# Patient Record
Sex: Male | Born: 1965 | Race: Black or African American | Hispanic: No | Smoking: Current some day smoker
Health system: Southern US, Community
[De-identification: ages and names within clinical notes are randomized; demographics above are authoritative.]

## PROBLEM LIST (undated history)

## (undated) DIAGNOSIS — I219 Acute myocardial infarction, unspecified: Secondary | ICD-10-CM

## (undated) DIAGNOSIS — I2510 Atherosclerotic heart disease of native coronary artery without angina pectoris: Secondary | ICD-10-CM

## (undated) DIAGNOSIS — E7800 Pure hypercholesterolemia, unspecified: Secondary | ICD-10-CM

## (undated) DIAGNOSIS — E119 Type 2 diabetes mellitus without complications: Secondary | ICD-10-CM

## (undated) DIAGNOSIS — I10 Essential (primary) hypertension: Secondary | ICD-10-CM

## (undated) HISTORY — PX: PERCUTANEOUS CORONARY STENT INTERVENTION (PCI-S): SHX6016

---

## 2014-09-29 ENCOUNTER — Ambulatory Visit: Payer: Self-pay

## 2014-09-29 ENCOUNTER — Encounter (INDEPENDENT_AMBULATORY_CARE_PROVIDER_SITE_OTHER): Payer: Self-pay

## 2020-02-15 ENCOUNTER — Emergency Department
Admission: EM | Admit: 2020-02-15 | Discharge: 2020-02-15 | Disposition: A | Discharge status: Home / Self Care | Payer: Medicaid Other | Attending: Student in an Organized Health Care Education/Training Program | Admitting: Student in an Organized Health Care Education/Training Program

## 2020-02-15 ENCOUNTER — Other Ambulatory Visit: Payer: Self-pay

## 2020-02-15 ENCOUNTER — Emergency Department: Payer: Medicaid Other

## 2020-02-15 DIAGNOSIS — M67911 Unspecified disorder of synovium and tendon, right shoulder: Secondary | ICD-10-CM | POA: Diagnosis not present

## 2020-02-15 DIAGNOSIS — M25511 Pain in right shoulder: Secondary | ICD-10-CM | POA: Diagnosis present

## 2020-02-15 DIAGNOSIS — M19011 Primary osteoarthritis, right shoulder: Secondary | ICD-10-CM | POA: Diagnosis not present

## 2020-02-15 MED ORDER — MELOXICAM 7.5 MG PO TABS
15.0000 mg | ORAL_TABLET | Freq: Once | ORAL | Status: AC
  Administered 2020-02-15: 15 mg via ORAL
  Filled 2020-02-15: qty 2

## 2020-02-15 MED ORDER — HYDROCODONE-ACETAMINOPHEN 5-325 MG PO TABS
1.0000 | ORAL_TABLET | Freq: Four times a day (QID) | ORAL | 0 refills | Status: AC | PRN
Start: 2020-02-15 — End: 2020-02-18

## 2020-02-15 MED ORDER — OXYCODONE HCL 5 MG PO TABS
5.0000 mg | ORAL_TABLET | Freq: Once | ORAL | Status: AC
  Administered 2020-02-15: 5 mg via ORAL
  Filled 2020-02-15: qty 1

## 2020-02-15 MED ORDER — MELOXICAM 15 MG PO TABS: 15.0000 mg | ORAL_TABLET | Freq: Every day | ORAL | 0 refills | Status: DC

## 2020-02-15 NOTE — ED Provider Notes (Signed)
[facility] Emergency Department Provider Note ____________________________________________  Time seen: Approximately 10:56 PM  I have reviewed the triage vital signs and the nursing notes.   HISTORY  Chief Complaint Shoulder Pain    HPI Mark Brooks is a 54 y.o. male who presents to the emergency department for evaluation and treatment of right shoulder pain. 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 No alleviating measures attempted prior to arrival.   No past medical history on file.  There are no problems to display for this patient.   Prior to Admission medications   Medication Sig Start Date End Date Taking? Authorizing Provider  HYDROcodone-acetaminophen (NORCO/VICODIN) 5-325 MG tablet Take 1 tablet by mouth every 6 (six) hours as needed for up to 3 days for severe pain. 02/15/20 02/18/20  Britlee Skolnik, Rulon Eisenmenger B, FNP  meloxicam (MOBIC) 15 MG tablet Take 1 tablet (15 mg total) by mouth daily. 02/15/20   Chinita Pester, FNP    Allergies Patient has no allergy information on record.  No family history on file.  Social History Social History   Tobacco Use  . Smoking status: Not on file  Substance Use Topics  . Alcohol use: Not on file  . Drug use: Not on file    Review of Systems Constitutional: Negative for fever. Cardiovascular: Negative for chest pain. Respiratory: Negative for shortness of breath. Musculoskeletal: Positive for right shoulder pain with radiation into the right arm and neck. Skin: Negative for open wounds or lesions. Neurological: Negative for decrease in sensation  ____________________________________________   PHYSICAL EXAM:  VITAL SIGNS: ED Triage Vitals  Enc Vitals Group     BP 02/15/20 1745 (!) 144/96     Pulse Rate 02/15/20 1745 88     Resp 02/15/20 1745 20     Temp 02/15/20 1745 99.3 F (37.4 C)     Temp Source 02/15/20 1745 Oral     SpO2  02/15/20 1745 99 %     Weight 02/15/20 1746 176 lb (79.8 kg)     Height 02/15/20 1746 5\' 8"  (1.727 m)     Head Circumference --      Peak Flow --      Pain Score 02/15/20 1754 10     Pain Loc --      Pain Edu? --      Excl. in GC? --     Constitutional: Alert and oriented. Well appearing and in no acute distress. Eyes: Conjunctivae are clear without discharge or drainage Head: Atraumatic Neck: Supple. No focal midline tenderness. Able to perform full range of motion Respiratory: No cough. Respirations are even and unlabored. Musculoskeletal: Diffuse subscapular pain with abduction and external rotation of the right shoulder. Painful arc at approximately 90 degrees but no drop. Neurologic: Motor and sensory function of the upper extremities are equal and intact. Skin: No edema, erythema, open wounds or lesions over the right upper extremity or subscapular area. Psychiatric: Affect and behavior are appropriate.  ____________________________________________   LABS (all labs ordered are listed, but only abnormal results are displayed)  Labs Reviewed - No data to display ____________________________________________  RADIOLOGY  Image of the right shoulder shows some mild degenerative joint disease but otherwise no acute endings.  I, 14/06/21, personally viewed and evaluated these images (plain radiographs) as part of my medical decision making, as well as reviewing the written report by the radiologist.  DG Shoulder Right  Result  Date: 02/15/2020 CLINICAL DATA:  Chronic right shoulder pain. EXAM: RIGHT SHOULDER - 2+ VIEW COMPARISON:  None. FINDINGS: There is no evidence of fracture or dislocation. Mild degenerative changes seen involving the right acromioclavicular joint. Soft tissues are unremarkable. IMPRESSION: Mild degenerative joint disease of the right acromioclavicular joint. No acute abnormality seen in the right shoulder. Electronically Signed   By: Lupita Raider M.D.    On: 02/15/2020 18:49   ____________________________________________   PROCEDURES  Procedures  ____________________________________________   INITIAL IMPRESSION / ASSESSMENT AND PLAN / ED COURSE  Mark Brooks is a 54 y.o. who presents to the emergency department for treatment and evaluation of chronic right shoulder pain. See HPI for further details. Exam is consistent with a rotator cuff tendinitis or strain. He also has some arthritis on plain film. He will be treated with meloxicam. He states that he is having difficulty getting comfortable at night. Short course of pain medication prescribed and he was encouraged to mainly take it at night before bed. He was encouraged to follow-up with orthopedics for symptoms that are not improving over the next week. He was encouraged to return to emergency department for symptoms of change or worsen if unable to schedule appointment.   Medications  meloxicam (MOBIC) tablet 15 mg (15 mg Oral Given 02/15/20 2313)  oxyCODONE (Oxy IR/ROXICODONE) immediate release tablet 5 mg (5 mg Oral Given 02/15/20 2314)    Pertinent labs & imaging results that were available during my care of the patient were reviewed by me and considered in my medical decision making (see chart for details).   _________________________________________   FINAL CLINICAL IMPRESSION(S) / ED DIAGNOSES  Final diagnoses:  Osteoarthritis of right shoulder, unspecified osteoarthritis type  Rotator cuff dysfunction, right    ED Discharge Orders         Ordered    meloxicam (MOBIC) 15 MG tablet  Daily        02/15/20 2304    HYDROcodone-acetaminophen (NORCO/VICODIN) 5-325 MG tablet  Every 6 hours PRN        02/15/20 2304           If controlled substance prescribed during this visit, 12 month history viewed on the [facility] prior to issuing an initial prescription for Schedule II or III opiod.   Chinita Pester, FNP 02/16/20 1710    Gilles Chiquito, MD 02/17/20 1030

## 2020-02-15 NOTE — ED Notes (Signed)
Pt walking home

## 2020-02-15 NOTE — ED Triage Notes (Signed)
Pt here with right shoulder and arm pain for a long time that has gotten worse over time. Pt NAD in triage.

## 2020-02-15 NOTE — Discharge Instructions (Signed)
Please follow up with orthopedics for symptoms that are not improving with medication and time.  Return to the ER for symptoms that change or worsen if unable to schedule an appointment.

## 2020-02-15 NOTE — ED Notes (Signed)
Pt acknowledges he will not be driving. Pt reports he is homeless and will not be walking.   Pt understands narcotics can cause drowsiness

## 2020-03-26 ENCOUNTER — Observation Stay
Admission: EM | Admit: 2020-03-26 | Discharge: 2020-03-28 | Disposition: A | Discharge status: Home / Self Care | Payer: Medicaid Other | Attending: Internal Medicine | Admitting: Internal Medicine

## 2020-03-26 ENCOUNTER — Encounter: Payer: Self-pay | Admitting: *Deleted

## 2020-03-26 DIAGNOSIS — R079 Chest pain, unspecified: Principal | ICD-10-CM | POA: Diagnosis present

## 2020-03-26 DIAGNOSIS — E1122 Type 2 diabetes mellitus with diabetic chronic kidney disease: Secondary | ICD-10-CM | POA: Insufficient documentation

## 2020-03-26 DIAGNOSIS — R778 Other specified abnormalities of plasma proteins: Secondary | ICD-10-CM | POA: Diagnosis not present

## 2020-03-26 DIAGNOSIS — Z20822 Contact with and (suspected) exposure to covid-19: Secondary | ICD-10-CM | POA: Diagnosis not present

## 2020-03-26 DIAGNOSIS — I5022 Chronic systolic (congestive) heart failure: Secondary | ICD-10-CM | POA: Diagnosis not present

## 2020-03-26 DIAGNOSIS — E1169 Type 2 diabetes mellitus with other specified complication: Secondary | ICD-10-CM

## 2020-03-26 DIAGNOSIS — N179 Acute kidney failure, unspecified: Secondary | ICD-10-CM | POA: Diagnosis not present

## 2020-03-26 DIAGNOSIS — N1831 Chronic kidney disease, stage 3a: Secondary | ICD-10-CM | POA: Diagnosis not present

## 2020-03-26 DIAGNOSIS — I130 Hypertensive heart and chronic kidney disease with heart failure and stage 1 through stage 4 chronic kidney disease, or unspecified chronic kidney disease: Secondary | ICD-10-CM | POA: Insufficient documentation

## 2020-03-26 DIAGNOSIS — I2510 Atherosclerotic heart disease of native coronary artery without angina pectoris: Secondary | ICD-10-CM | POA: Diagnosis not present

## 2020-03-26 DIAGNOSIS — Z87891 Personal history of nicotine dependence: Secondary | ICD-10-CM | POA: Diagnosis not present

## 2020-03-26 DIAGNOSIS — I160 Hypertensive urgency: Secondary | ICD-10-CM | POA: Diagnosis present

## 2020-03-26 HISTORY — DX: Acute myocardial infarction, unspecified: I21.9

## 2020-03-26 HISTORY — DX: Atherosclerotic heart disease of native coronary artery without angina pectoris: I25.10

## 2020-03-26 LAB — CBC
HCT: 43.6 % (ref 39.0–52.0)
Hemoglobin: 14.1 g/dL (ref 13.0–17.0)
MCH: 28.4 pg (ref 26.0–34.0)
MCHC: 32.3 g/dL (ref 30.0–36.0)
MCV: 87.7 fL (ref 80.0–100.0)
Platelets: 212 10*3/uL (ref 150–400)
RBC: 4.97 MIL/uL (ref 4.22–5.81)
RDW: 13.3 % (ref 11.5–15.5)
WBC: 6.5 10*3/uL (ref 4.0–10.5)
nRBC: 0 % (ref 0.0–0.2)

## 2020-03-26 LAB — BASIC METABOLIC PANEL
Anion gap: 10 (ref 5–15)
BUN: 30 mg/dL — ABNORMAL HIGH (ref 6–20)
CO2: 26 mmol/L (ref 22–32)
Calcium: 8.7 mg/dL — ABNORMAL LOW (ref 8.9–10.3)
Chloride: 100 mmol/L (ref 98–111)
Creatinine, Ser: 1.76 mg/dL — ABNORMAL HIGH (ref 0.61–1.24)
GFR, Estimated: 45 mL/min — ABNORMAL LOW (ref >60)
Glucose, Bld: 319 mg/dL — ABNORMAL HIGH (ref 70–99)
Potassium: 4.2 mmol/L (ref 3.5–5.1)
Sodium: 136 mmol/L (ref 135–145)

## 2020-03-26 LAB — BRAIN NATRIURETIC PEPTIDE: B Natriuretic Peptide: 1051.3 pg/mL — ABNORMAL HIGH (ref 0.0–100.0)

## 2020-03-26 LAB — CBG MONITORING, ED
Glucose-Capillary: 295 mg/dL — ABNORMAL HIGH (ref 70–99)
Glucose-Capillary: 319 mg/dL — ABNORMAL HIGH (ref 70–99)
Glucose-Capillary: 345 mg/dL — ABNORMAL HIGH (ref 70–99)

## 2020-03-26 LAB — TROPONIN I (HIGH SENSITIVITY)
Troponin I (High Sensitivity): 62 ng/L — ABNORMAL HIGH (ref <18)
Troponin I (High Sensitivity): 57 ng/L — ABNORMAL HIGH (ref <18)
Troponin I (High Sensitivity): 48 ng/L — ABNORMAL HIGH (ref <18)
Troponin I (High Sensitivity): 48 ng/L — ABNORMAL HIGH (ref <18)

## 2020-03-26 LAB — URINE DRUG SCREEN, QUALITATIVE (ARMC ONLY)
Amphetamines, Ur Screen: NOT DETECTED
Barbiturates, Ur Screen: NOT DETECTED
Benzodiazepine, Ur Scrn: NOT DETECTED
Cannabinoid 50 Ng, Ur ~~LOC~~: NOT DETECTED
Cocaine Metabolite,Ur ~~LOC~~: NOT DETECTED
MDMA (Ecstasy)Ur Screen: NOT DETECTED
Methadone Scn, Ur: NOT DETECTED
Opiate, Ur Screen: NOT DETECTED
Phencyclidine (PCP) Ur S: NOT DETECTED
Tricyclic, Ur Screen: NOT DETECTED

## 2020-03-26 LAB — MAGNESIUM: Magnesium: 1.9 mg/dL (ref 1.7–2.4)

## 2020-03-26 MED ORDER — ACETAMINOPHEN 325 MG PO TABS: 650.0000 mg | ORAL_TABLET | Freq: Four times a day (QID) | ORAL | Status: DC | PRN

## 2020-03-26 MED ORDER — ENOXAPARIN SODIUM 30 MG/0.3ML ~~LOC~~ SOLN
30.0000 mg | SUBCUTANEOUS | Status: DC
  Administered 2020-03-27: 30 mg via SUBCUTANEOUS
  Filled 2020-03-26 (×2): qty 0.3

## 2020-03-26 MED ORDER — MORPHINE SULFATE (PF) 2 MG/ML IV SOLN
2.0000 mg | INTRAVENOUS | Status: DC | PRN
  Administered 2020-03-27: 2 mg via INTRAVENOUS
  Filled 2020-03-26: qty 1

## 2020-03-26 MED ORDER — TAMSULOSIN HCL 0.4 MG PO CAPS
0.4000 mg | ORAL_CAPSULE | Freq: Every day | ORAL | Status: DC
  Administered 2020-03-27 – 2020-03-28 (×2): 0.4 mg via ORAL
  Filled 2020-03-26 (×2): qty 1

## 2020-03-26 MED ORDER — DORZOLAMIDE HCL-TIMOLOL MAL 2-0.5 % OP SOLN
1.0000 [drp] | Freq: Two times a day (BID) | OPHTHALMIC | Status: DC
  Filled 2020-03-26: qty 10

## 2020-03-26 MED ORDER — INSULIN ASPART PROT & ASPART (70-30 MIX) 100 UNIT/ML ~~LOC~~ SUSP
20.0000 [IU] | Freq: Two times a day (BID) | SUBCUTANEOUS | Status: DC
  Administered 2020-03-27 – 2020-03-28 (×3): 20 [IU] via SUBCUTANEOUS
  Filled 2020-03-26 (×3): qty 10

## 2020-03-26 MED ORDER — AMLODIPINE BESYLATE 5 MG PO TABS
10.0000 mg | ORAL_TABLET | Freq: Every day | ORAL | Status: DC
  Administered 2020-03-27 – 2020-03-28 (×2): 10 mg via ORAL
  Filled 2020-03-26 (×2): qty 2

## 2020-03-26 MED ORDER — ISOSORBIDE MONONITRATE ER 60 MG PO TB24
30.0000 mg | ORAL_TABLET | Freq: Every day | ORAL | Status: DC
  Administered 2020-03-27 – 2020-03-28 (×2): 30 mg via ORAL
  Filled 2020-03-26 (×2): qty 1

## 2020-03-26 MED ORDER — METOPROLOL SUCCINATE ER 50 MG PO TB24
50.0000 mg | ORAL_TABLET | Freq: Every day | ORAL | Status: DC
  Administered 2020-03-27 – 2020-03-28 (×2): 50 mg via ORAL
  Filled 2020-03-26 (×2): qty 1

## 2020-03-26 MED ORDER — FUROSEMIDE 40 MG PO TABS
40.0000 mg | ORAL_TABLET | Freq: Every day | ORAL | Status: DC
  Administered 2020-03-27: 40 mg via ORAL
  Filled 2020-03-26: qty 1

## 2020-03-26 MED ORDER — NITROGLYCERIN 0.4 MG SL SUBL: 0.4000 mg | SUBLINGUAL_TABLET | SUBLINGUAL | Status: DC | PRN

## 2020-03-26 MED ORDER — ONDANSETRON HCL 4 MG/2ML IJ SOLN
4.0000 mg | Freq: Three times a day (TID) | INTRAMUSCULAR | Status: DC | PRN
  Administered 2020-03-27: 4 mg via INTRAVENOUS
  Filled 2020-03-26: qty 2

## 2020-03-26 MED ORDER — ASPIRIN 81 MG PO CHEW
324.0000 mg | CHEWABLE_TABLET | Freq: Once | ORAL | Status: AC
  Administered 2020-03-26: 324 mg via ORAL
  Filled 2020-03-26: qty 4

## 2020-03-26 MED ORDER — NICOTINE 21 MG/24HR TD PT24
21.0000 mg | MEDICATED_PATCH | Freq: Every day | TRANSDERMAL | Status: DC
  Administered 2020-03-27 – 2020-03-28 (×2): 21 mg via TRANSDERMAL
  Filled 2020-03-26 (×2): qty 1

## 2020-03-26 MED ORDER — INSULIN ASPART 100 UNIT/ML ~~LOC~~ SOLN
0.0000 [IU] | Freq: Every day | SUBCUTANEOUS | Status: DC
  Administered 2020-03-26: 4 [IU] via SUBCUTANEOUS
  Filled 2020-03-26: qty 1

## 2020-03-26 MED ORDER — INSULIN ASPART 100 UNIT/ML ~~LOC~~ SOLN
0.0000 [IU] | Freq: Three times a day (TID) | SUBCUTANEOUS | Status: DC
  Administered 2020-03-26: 5 [IU] via SUBCUTANEOUS
  Administered 2020-03-26: 11 [IU] via SUBCUTANEOUS
  Filled 2020-03-26 (×2): qty 1

## 2020-03-26 MED ORDER — PANTOPRAZOLE SODIUM 40 MG PO TBEC
40.0000 mg | DELAYED_RELEASE_TABLET | Freq: Every day | ORAL | Status: DC
  Administered 2020-03-27 – 2020-03-28 (×2): 40 mg via ORAL
  Filled 2020-03-26 (×2): qty 1

## 2020-03-26 MED ORDER — ATORVASTATIN CALCIUM 20 MG PO TABS
20.0000 mg | ORAL_TABLET | Freq: Every day | ORAL | Status: DC
  Administered 2020-03-27 – 2020-03-28 (×2): 20 mg via ORAL
  Filled 2020-03-26 (×2): qty 1

## 2020-03-26 MED ORDER — HYDRALAZINE HCL 20 MG/ML IJ SOLN
5.0000 mg | INTRAMUSCULAR | Status: DC | PRN
  Administered 2020-03-27: 5 mg via INTRAVENOUS
  Filled 2020-03-26: qty 1

## 2020-03-26 MED ORDER — CLOPIDOGREL BISULFATE 75 MG PO TABS
75.0000 mg | ORAL_TABLET | Freq: Every day | ORAL | Status: DC
  Administered 2020-03-27 – 2020-03-28 (×2): 75 mg via ORAL
  Filled 2020-03-26 (×2): qty 1

## 2020-03-26 MED ORDER — SPIRONOLACTONE 25 MG PO TABS
25.0000 mg | ORAL_TABLET | Freq: Every day | ORAL | Status: DC
  Administered 2020-03-27 – 2020-03-28 (×2): 25 mg via ORAL
  Filled 2020-03-26 (×2): qty 1

## 2020-03-26 NOTE — ED Notes (Signed)
Pt to room 38 now. Pt standing at bedside to use urinal. Will attach to monitor once done.

## 2020-03-26 NOTE — ED Notes (Signed)
BG=[id number]

## 2020-03-26 NOTE — Consult Note (Signed)
Mark Brooks is a 55 y.o. male  [id number]  Primary Cardiologist: Adrian Blackwater Reason for Consultation: Chest pain  HPI: This is a 55 year old African-American male with history of PCI and stenting after myocardial infarction at [facility] in March 2021 presented to the hospital with whole body pain including back pain right shoulder pain and right arm pain radiating to the back associated with some pressure in the chest which woke him up from sleep.   Review of Systems: No orthopnea PND or leg swelling   Past Medical History:  Diagnosis Date  . Coronary artery disease   . Myocardial infarct [facility])     (Not in a hospital admission)      Infusions:   No Known Allergies  Social History   Socioeconomic History  . Marital status: Divorced    Spouse name: Not on file  . Number of children: Not on file  . Years of education: Not on file  . Highest education level: Not on file  Occupational History  . Not on file  Tobacco Use  . Smoking status: Former Games developer  . Smokeless tobacco: Never Used  Substance and Sexual Activity  . Alcohol use: Not on file  . Drug use: Not on file  . Sexual activity: Not on file  Other Topics Concern  . Not on file  Social History Narrative  . Not on file   Social Determinants of Health   Financial Resource Strain: Not on file  Food Insecurity: Not on file  Transportation Needs: Not on file  Physical Activity: Not on file  Stress: Not on file  Social Connections: Not on file  Intimate Partner Violence: Not on file    History reviewed. No pertinent family history.  PHYSICAL EXAM: Vitals:   03/26/20 0725  BP: (!) 156/98  Pulse: 90  Resp: 16  Temp: 98.3 F (36.8 C)  SpO2: 100%    No intake or output data in the 24 hours ending 03/26/20 0955  General:  Well appearing. No respiratory difficulty HEENT: normal Neck: supple. no JVD. Carotids 2+ bilat; no bruits. No lymphadenopathy or thryomegaly appreciated. Cor: PMI  nondisplaced. Regular rate & rhythm. No rubs, gallops or murmurs. Lungs: clear Abdomen: soft, nontender, nondistended. No hepatosplenomegaly. No bruits or masses. Good bowel sounds. Extremities: no cyanosis, clubbing, rash, edema Neuro: alert & oriented x 3, cranial nerves grossly intact. moves all 4 extremities w/o difficulty. Affect pleasant.  ECG: Normal sinus rhythm with nonspecific ST-T changes  Results for orders placed or performed during the hospital encounter of 03/26/20 (from the past 24 hour(s))  Basic metabolic panel     Status: Abnormal   Collection Time: 03/26/20  6:48 AM  Result Value Ref Range   Sodium 136 135 - 145 mmol/L   Potassium 4.2 3.5 - 5.1 mmol/L   Chloride 100 98 - 111 mmol/L   CO2 26 22 - 32 mmol/L   Glucose, Bld 319 (H) 70 - 99 mg/dL   BUN 30 (H) 6 - 20 mg/dL   Creatinine, Ser 6.65 (H) 0.61 - 1.24 mg/dL   Calcium 8.7 (L) 8.9 - 10.3 mg/dL   GFR, Estimated 45 (L) >60 mL/min   Anion gap 10 5 - 15  CBC     Status: None   Collection Time: 03/26/20  6:48 AM  Result Value Ref Range   WBC 6.5 4.0 - 10.5 K/uL   RBC 4.97 4.22 - 5.81 MIL/uL   Hemoglobin 14.1 13.0 - 17.0 g/dL  HCT 43.6 39.0 - 52.0 %   MCV 87.7 80.0 - 100.0 fL   MCH 28.4 26.0 - 34.0 pg   MCHC 32.3 30.0 - 36.0 g/dL   RDW 70.3 50.0 - 93.8 %   Platelets 212 150 - 400 K/uL   nRBC 0.0 0.0 - 0.2 %  Troponin I (High Sensitivity)     Status: Abnormal   Collection Time: 03/26/20  6:48 AM  Result Value Ref Range   Troponin I (High Sensitivity) 62 (H) <18 ng/L  Magnesium     Status: None   Collection Time: 03/26/20  6:48 AM  Result Value Ref Range   Magnesium 1.9 1.7 - 2.4 mg/dL   No results found.   ASSESSMENT AND PLAN: Atypical chest pain with EKG unremarkable with history of PCI and stenting.  Advise starting the patient on isosorbide and aspirin/Plavix and statins, and rule out myocardial infarction, start heparin if chest pain gets worse.  If he rules out for myocardial infarction he can be  discharged with follow-up in the office 930 Tuesday in my office.  And will do further work-up such as echo and stress test.  Alora Gorey A

## 2020-03-26 NOTE — H&P (Signed)
History and Physical    Mark Brooks MBT:[id number] DOB: 1965/05/25 DOA: 03/26/2020  Referring MD/NP/PA:   PCP: Patient, No Pcp Per   Patient coming from:  The patient is coming from home.  At baseline, pt is independent for most of ADL.        Chief Complaint: Chest pain  HPI: Mark Brooks is a 55 y.o. male with medical history significant of hypertension, diabetes mellitus, CAD, myocardial infarction, stent placement, former smoker, CHF with a EF 30%, CKD stage IIIa, who presents with chest pain.  Patient states that his chest pain started at about 4 AM, which is located in central chest, moderate, pressure-like, radiating to the back and right shoulder.  Associated with mild shortness of breath.  No cough, fever or chills.  Patient denies diarrhea, nausea, vomiting, abdominal pain.  No symptoms of UTI or unilateral weakness.  ED Course: pt was found to have troponin level 62, 53, 48, pending COVID PCR, worsening renal function, temperature normal, blood pressure 180/107, 155/110, heart rate 90, RR 16, oxygen saturation 99% on room air.  Patient is placed on progressive for observation.  Dr. Welton Flakes of cardiology is consulted  Review of Systems:   General: no fevers, chills, no body weight gain,  has fatigue HEENT: no blurry vision, hearing changes or sore throat Respiratory: has dyspnea, no coughing, wheezing CV: has chest pain, no palpitations GI: no nausea, vomiting, abdominal pain, diarrhea, constipation GU: no dysuria, burning on urination, increased urinary frequency, hematuria  Ext: no leg edema Neuro: no unilateral weakness, numbness, or tingling, no vision change or hearing loss Skin: no rash, no skin tear. MSK: No muscle spasm, no deformity, no limitation of range of movement in spin. Has right shoulder pain Heme: No easy bruising.  Travel history: No recent long distant travel.  Allergy: No Known Allergies  Past Medical History:  Diagnosis Date  . Coronary artery  disease   . Myocardial infarct [facility])     Past Surgical History:  Procedure Laterality Date  . PERCUTANEOUS CORONARY STENT INTERVENTION (PCI-S)      Social History:  reports that he has quit smoking. He has never used smokeless tobacco. He reports previous alcohol use. He reports that he does not use drugs.  Family History:  Family History  Problem Relation Age of Onset  . Diabetes Mellitus II Mother   . Heart disease Father      Prior to Admission medications   Medication Sig Start Date End Date Taking? Authorizing Provider  meloxicam (MOBIC) 15 MG tablet Take 1 tablet (15 mg total) by mouth daily. 02/15/20   Chinita Pester, FNP    Physical Exam: Vitals:   03/26/20 0725 03/26/20 1000 03/26/20 1409 03/26/20 1600  BP: (!) 156/98 (!) 151/110 (!) 159/108 (!) 163/104  Pulse: 90 90 97 (!) 104  Resp: 16 13    Temp: 98.3 F (36.8 C)     TempSrc: Oral     SpO2: 100% 99% 94% 95%   General: Not in acute distress HEENT:       Eyes: PERRL, EOMI, no scleral icterus.       ENT: No discharge from the ears and nose, no pharynx injection, no tonsillar enlargement.        Neck: No JVD, no bruit, no mass felt. Heme: No neck lymph node enlargement. Cardiac: S1/S2, RRR, No murmurs, No gallops or rubs. Respiratory: No rales, wheezing, rhonchi or rubs. GI: Soft, nondistended, nontender, no rebound pain, no organomegaly, BS present.  GU: No hematuria Ext: No pitting leg edema bilaterally. 2+DP/PT pulse bilaterally. Musculoskeletal: No joint deformities, No joint redness or warmth, no limitation of ROM in spin. Skin: No rashes.  Neuro: Alert, oriented X3, cranial nerves II-XII grossly intact, moves all extremities normally. Psych: Patient is not psychotic, no suicidal or hemocidal ideation.  Labs on Admission: I have personally reviewed following labs and imaging studies  CBC: Recent Labs  Lab 03/26/20 0648  WBC 6.5  HGB 14.1  HCT 43.6  MCV 87.7  PLT 212   Basic Metabolic  Panel: Recent Labs  Lab 03/26/20 0648  NA 136  K 4.2  CL 100  CO2 26  GLUCOSE 319*  BUN 30*  CREATININE 1.76*  CALCIUM 8.7*  MG 1.9   GFR: CrCl cannot be calculated (Unknown ideal weight.). Liver Function Tests: No results for input(s): AST, ALT, ALKPHOS, BILITOT, PROT, ALBUMIN in the last 168 hours. No results for input(s): LIPASE, AMYLASE in the last 168 hours. No results for input(s): AMMONIA in the last 168 hours. Coagulation Profile: No results for input(s): INR, PROTIME in the last 168 hours. Cardiac Enzymes: No results for input(s): CKTOTAL, CKMB, CKMBINDEX, TROPONINI in the last 168 hours. BNP (last 3 results) No results for input(s): PROBNP in the last 8760 hours. HbA1C: No results for input(s): HGBA1C in the last 72 hours. CBG: Recent Labs  Lab 03/26/20 1343  GLUCAP 295*   Lipid Profile: No results for input(s): CHOL, HDL, LDLCALC, TRIG, CHOLHDL, LDLDIRECT in the last 72 hours. Thyroid Function Tests: No results for input(s): TSH, T4TOTAL, FREET4, T3FREE, THYROIDAB in the last 72 hours. Anemia Panel: No results for input(s): VITAMINB12, FOLATE, FERRITIN, TIBC, IRON, RETICCTPCT in the last 72 hours. Urine analysis: No results found for: COLORURINE, APPEARANCEUR, LABSPEC, PHURINE, GLUCOSEU, HGBUR, BILIRUBINUR, KETONESUR, PROTEINUR, UROBILINOGEN, NITRITE, LEUKOCYTESUR Sepsis Labs: @LABRCNTIP (procalcitonin:4,lacticidven:4) )No results found for this or any previous visit (from the past 240 hour(s)).   Radiological Exams on Admission: No results found.   EKG: I have personally reviewed.  Sinus rhythm, QTC 472, LAE, mild T wave inversion in V4-V6   Assessment/Plan Principal Problem:   Chest pain Active Problems:   Coronary artery disease   Acute renal failure superimposed on stage 3a chronic kidney disease (HCC)   Chronic systolic CHF (congestive heart failure) (HCC)   Hypertensive urgency   Elevated troponin   Chest pain, elevated trop and hx of  CAD: s/p of stent. Trop 62 -->57 -->48.  Likely due to demand ischemia secondary to hypertensive urgency. Dr.Khan of card is consulted.    - place to progressive unit for observation - Trend Trop - Repeat EKG in the am  - prn Nitroglycerin, Morphine - pt is on plavix - start imdur - on lipitor  - Risk factor stratification: will check FLP and A1C  - check UDS - 2d echo  Acute renal failure superimposed on stage 3a chronic kidney disease (HCC): Baseline creatinine 1.3 recently, his creatinine is 1.76, BUN 30. -Hold lisinopril -Follow-up with BMP  Chronic systolic CHF (congestive heart failure) (HCC): 2D echo 01/13/2017 showed a EF of 30%.  Patient does not have leg edema or JVD.  CHF is compensated. -Continue Lasix and spironolactone  Hypertensive urgency -Increase amlodipine dose from 5 to 10 mg daily -Continue metoprolol -Patient is on Lasix and spironolactone - Added Imdur    DVT ppx:  SQ Lovenox Code Status: Full code Family Communication:    Yes, patient's girlfriend   at bed side Disposition Plan:  Anticipate  discharge back to previous environment Consults called: Dr. Welton Flakes of cardiology Admission status:    progressive unit for obs   Status is: Observation  The patient remains OBS appropriate and will d/c before 2 midnights.  Dispo: The patient is from: Home              Anticipated d/c is to: Home              Anticipated d/c date is: 1 day              Patient currently is not medically stable to d/c.         Date of Service 03/26/2020    Lorretta Harp [facility]   If 7PM-7AM, please contact night-coverage [email or web address] 03/26/2020, 6:24 PM

## 2020-03-26 NOTE — ED Triage Notes (Signed)
First rn note: per ems pt with right sided shoulder blade pain. Per ems pt with 180/107 b/p, hr 94. Per ems pt also complains of shob this am.

## 2020-03-26 NOTE — ED Triage Notes (Signed)
Patient reports right sided shoulder pain, chronic in nature, that radiates down arm and into back. Patient also states central chest pain that woke him up from his sleep associated with shortness of breath.

## 2020-03-26 NOTE — ED Notes (Signed)
Blood glucose 319.

## 2020-03-26 NOTE — ED Provider Notes (Signed)
[facility] Emergency Department Provider Note   ____________________________________________   Event Date/Time   First MD Initiated Contact with Patient 03/26/20 0930     (approximate)  I have reviewed the triage vital signs and the nursing notes.   HISTORY  Chief Complaint Back Pain, Chest Pain, and Shortness of Breath    HPI Mark Brooks is a 55 y.o. male with past medical history of hypertension, hyperlipidemia, diabetes, CAD, and CKD who presents to the ED complaining of chest pain. Patient reports last night he had sudden onset of pressure in the center of his chest, which he states felt like someone was sitting on his chest. This was associated with some difficulty breathing, however pain and shortness of breath have eased up since onset. He does complain of chronic pain in the area of his neck and right shoulder blade that radiates down his right arm. He states this is different than the chest pain, which came on separately. He denies any recent fevers, cough, pain or swelling in his legs. He was admitted to [facility] for STEMI last year, but has not been following up with cardiology since then.        Past Medical History:  Diagnosis Date  . Coronary artery disease   . Myocardial infarct [facility])     Patient Active Problem List   Diagnosis Date Noted  . Chest pain 03/26/2020  . Coronary artery disease   . Acute renal failure superimposed on stage 3a chronic kidney disease (HCC)   . Chronic systolic CHF (congestive heart failure) (HCC)   . Hypertensive urgency   . Elevated troponin     History reviewed. No pertinent surgical history.  Prior to Admission medications   Medication Sig Start Date End Date Taking? Authorizing Provider  meloxicam (MOBIC) 15 MG tablet Take 1 tablet (15 mg total) by mouth daily. 02/15/20   Chinita Pester, FNP    Allergies Patient has no known allergies.  History reviewed. No pertinent family history.  Social  History Social History   Tobacco Use  . Smoking status: Former Games developer  . Smokeless tobacco: Never Used    Review of Systems  Constitutional: No fever/chills Eyes: No visual changes. ENT: No sore throat. Cardiovascular: Positive for chest pain. Respiratory: Positive for shortness of breath. Gastrointestinal: No abdominal pain.  No nausea, no vomiting.  No diarrhea.  No constipation. Genitourinary: Negative for dysuria. Musculoskeletal: Negative for back pain. Positive for neck pain and right arm pain. Skin: Negative for rash. Neurological: Negative for headaches, focal weakness or numbness.  ____________________________________________   PHYSICAL EXAM:  VITAL SIGNS: ED Triage Vitals  Enc Vitals Group     BP 03/26/20 0725 (!) 156/98     Pulse Rate 03/26/20 0725 90     Resp 03/26/20 0725 16     Temp 03/26/20 0725 98.3 F (36.8 C)     Temp Source 03/26/20 0725 Oral     SpO2 03/26/20 0725 100 %     Weight --      Height --      Head Circumference --      Peak Flow --      Pain Score 03/26/20 0720 10     Pain Loc --      Pain Edu? --      Excl. in GC? --     Constitutional: Alert and oriented. Eyes: Conjunctivae are normal. Head: Atraumatic. Nose: No congestion/rhinnorhea. Mouth/Throat: Mucous membranes are moist. Neck: Normal ROM Cardiovascular: Normal  rate, regular rhythm. Grossly normal heart sounds. 2+ radial pulses bilaterally. Respiratory: Normal respiratory effort.  No retractions. Lungs CTAB. No chest wall tenderness to palpation. Gastrointestinal: Soft and nontender. No distention. Genitourinary: deferred Musculoskeletal: No lower extremity tenderness nor edema. Neurologic:  Normal speech and language. No gross focal neurologic deficits are appreciated. Skin:  Skin is warm, dry and intact. No rash noted. Psychiatric: Mood and affect are normal. Speech and behavior are normal.  ____________________________________________   LABS (all labs ordered are  listed, but only abnormal results are displayed)  Labs Reviewed  BASIC METABOLIC PANEL - Abnormal; Notable for the following components:      Result Value   Glucose, Bld 319 (*)    BUN 30 (*)    Creatinine, Ser 1.76 (*)    Calcium 8.7 (*)    GFR, Estimated 45 (*)    All other components within normal limits  TROPONIN I (HIGH SENSITIVITY) - Abnormal; Notable for the following components:   Troponin I (High Sensitivity) 62 (*)    All other components within normal limits  TROPONIN I (HIGH SENSITIVITY) - Abnormal; Notable for the following components:   Troponin I (High Sensitivity) 57 (*)    All other components within normal limits  SARS CORONAVIRUS 2 (TAT 6-24 HRS)  CBC  MAGNESIUM  BRAIN NATRIURETIC PEPTIDE  URINE DRUG SCREEN, QUALITATIVE ([facility] ONLY)  TROPONIN I (HIGH SENSITIVITY)   ____________________________________________  EKG  ED ECG REPORT I, Chesley Noon, the attending physician, personally viewed and interpreted this ECG.   Date: 03/26/2020  EKG Time: 7:33  Rate: 92  Rhythm: normal sinus rhythm  Axis: Normal  Intervals:Prolonged QT  ST&T Change: Nonspecific T wave changes   PROCEDURES  Procedure(s) performed (including Critical Care):  Procedures   ____________________________________________   INITIAL IMPRESSION / ASSESSMENT AND PLAN / ED COURSE       55 year old male with past medical history of hypertension, hyperlipidemia, diabetes, CKD, and CAD who presents to the ED complaining of acute onset chest pressure starting last night that has since eased up. EKG shows nonspecific T wave changes and initial troponin is mildly elevated at 62. Elevation could be related to chronic kidney disease however there is no prior prior comparison, we will hold off on heparin for now and trend given easing of patient's chest pain. Chest x-ray reviewed by me and shows no infiltrate, edema, or effusion. Case discussed with Dr. Welton Flakes of cardiology, who will see the  patient in consultation, recommends admission for observation given high risk chest pain.      ____________________________________________   FINAL CLINICAL IMPRESSION(S) / ED DIAGNOSES  Final diagnoses:  Chest pain, unspecified type     ED Discharge Orders    None       Note:  This document was prepared using Dragon voice recognition software and may include unintentional dictation errors.   Chesley Noon, MD 03/26/20 1101

## 2020-03-27 ENCOUNTER — Observation Stay
Admit: 2020-03-27 | Discharge: 2020-03-27 | Disposition: A | Discharge status: Home / Self Care | Payer: Medicaid Other | Attending: Internal Medicine | Admitting: Internal Medicine

## 2020-03-27 DIAGNOSIS — N1831 Chronic kidney disease, stage 3a: Secondary | ICD-10-CM | POA: Diagnosis not present

## 2020-03-27 DIAGNOSIS — R079 Chest pain, unspecified: Secondary | ICD-10-CM | POA: Diagnosis not present

## 2020-03-27 DIAGNOSIS — N179 Acute kidney failure, unspecified: Secondary | ICD-10-CM | POA: Diagnosis not present

## 2020-03-27 DIAGNOSIS — I160 Hypertensive urgency: Secondary | ICD-10-CM | POA: Diagnosis not present

## 2020-03-27 LAB — CBC
HCT: 42.1 % (ref 39.0–52.0)
Hemoglobin: 14.3 g/dL (ref 13.0–17.0)
MCH: 28.7 pg (ref 26.0–34.0)
MCHC: 34 g/dL (ref 30.0–36.0)
MCV: 84.4 fL (ref 80.0–100.0)
Platelets: 214 10*3/uL (ref 150–400)
RBC: 4.99 MIL/uL (ref 4.22–5.81)
RDW: 13.2 % (ref 11.5–15.5)
WBC: 9.2 10*3/uL (ref 4.0–10.5)
nRBC: 0 % (ref 0.0–0.2)

## 2020-03-27 LAB — BASIC METABOLIC PANEL
Anion gap: 8 (ref 5–15)
BUN: 26 mg/dL — ABNORMAL HIGH (ref 6–20)
CO2: 24 mmol/L (ref 22–32)
Calcium: 8.6 mg/dL — ABNORMAL LOW (ref 8.9–10.3)
Chloride: 102 mmol/L (ref 98–111)
Creatinine, Ser: 1.73 mg/dL — ABNORMAL HIGH (ref 0.61–1.24)
GFR, Estimated: 46 mL/min — ABNORMAL LOW (ref >60)
Glucose, Bld: 218 mg/dL — ABNORMAL HIGH (ref 70–99)
Potassium: 4 mmol/L (ref 3.5–5.1)
Sodium: 134 mmol/L — ABNORMAL LOW (ref 135–145)

## 2020-03-27 LAB — LIPID PANEL
Cholesterol: 248 mg/dL — ABNORMAL HIGH (ref 0–200)
HDL: 36 mg/dL — ABNORMAL LOW (ref >40)
LDL Cholesterol: 199 mg/dL — ABNORMAL HIGH (ref 0–99)
Total CHOL/HDL Ratio: 6.9 RATIO
Triglycerides: 65 mg/dL (ref <150)
VLDL: 13 mg/dL (ref 0–40)

## 2020-03-27 LAB — CBG MONITORING, ED
Glucose-Capillary: 302 mg/dL — ABNORMAL HIGH (ref 70–99)
Glucose-Capillary: 280 mg/dL — ABNORMAL HIGH (ref 70–99)
Glucose-Capillary: 379 mg/dL — ABNORMAL HIGH (ref 70–99)
Glucose-Capillary: 288 mg/dL — ABNORMAL HIGH (ref 70–99)
Glucose-Capillary: 165 mg/dL — ABNORMAL HIGH (ref 70–99)

## 2020-03-27 LAB — TROPONIN I (HIGH SENSITIVITY)
Troponin I (High Sensitivity): 62 ng/L — ABNORMAL HIGH (ref <18)
Troponin I (High Sensitivity): 57 ng/L — ABNORMAL HIGH (ref <18)

## 2020-03-27 LAB — HIV ANTIBODY (ROUTINE TESTING W REFLEX): HIV Screen 4th Generation wRfx: NONREACTIVE

## 2020-03-27 LAB — HEMOGLOBIN A1C
Hgb A1c MFr Bld: 12.6 % — ABNORMAL HIGH (ref 4.8–5.6)
Mean Plasma Glucose: 314.92 mg/dL

## 2020-03-27 LAB — SARS CORONAVIRUS 2 (TAT 6-24 HRS): SARS Coronavirus 2: NEGATIVE

## 2020-03-27 MED ORDER — LABETALOL HCL 5 MG/ML IV SOLN: 10.0000 mg | INTRAVENOUS | Status: DC | PRN

## 2020-03-27 MED ORDER — INSULIN ASPART 100 UNIT/ML ~~LOC~~ SOLN
10.0000 [IU] | Freq: Once | SUBCUTANEOUS | Status: AC
  Administered 2020-03-27: 10 [IU] via SUBCUTANEOUS

## 2020-03-27 MED ORDER — INSULIN ASPART 100 UNIT/ML ~~LOC~~ SOLN
0.0000 [IU] | Freq: Three times a day (TID) | SUBCUTANEOUS | Status: DC
  Administered 2020-03-27: 11 [IU] via SUBCUTANEOUS
  Administered 2020-03-27: 8 [IU] via SUBCUTANEOUS
  Administered 2020-03-27: 3 [IU] via SUBCUTANEOUS
  Administered 2020-03-27: 8 [IU] via SUBCUTANEOUS
  Administered 2020-03-28: 15 [IU] via SUBCUTANEOUS
  Filled 2020-03-27 (×3): qty 1

## 2020-03-27 NOTE — Progress Notes (Signed)
*  PRELIMINARY RESULTS* Echocardiogram 2D Echocardiogram has been performed.  Mark Brooks M Mark Brooks 03/27/2020, 8:57 AM 

## 2020-03-27 NOTE — ED Notes (Addendum)
PRN Hydralazine 5mg  given to pt d/t BP being 181/108.  After receiving hydralazine, pt stated he felt dizzy, lightheaded and nauseous.  BP retaken and was  115/76.  , NP notified of large drop in BP.  Will closely monitor pt.

## 2020-03-27 NOTE — ED Notes (Signed)
Pharmacy called, second request for lovenox. States will send.

## 2020-03-27 NOTE — Progress Notes (Signed)
PROGRESS NOTE    Mark Brooks  AST:[id number] DOB: 11-Apr-1965 DOA: 03/26/2020 PCP: Patient, No Pcp Per    Assessment & Plan:   Principal Problem:   Chest pain Active Problems:   Coronary artery disease   Acute renal failure superimposed on stage 3a chronic kidney disease (HCC)   Chronic systolic CHF (congestive heart failure) (HCC)   Hypertensive urgency   Elevated troponin  Chest pain: w/ elevated troponins, likely secondary to demand ischemia. Hx of CAD. Echo pending. Continue on tele. Cardio following and recs apprec. Continue on plavix, imdur, statin. Nitro, morphine prn for pain    AKI on CKDIIIa: baseline Cr around 1.3. Continue to hold home dose of lisinopril  Chronic systolic CHF: echo 01/13/2017 showed a EF of 30%. Appears euvolemic. Continue on home dose of lasix, spironolactone   Hypertensive urgency: continue on increased dose of amlodipine. Continue on metoprolol, lasix, imdur & spironolactone   Social issues: pt is homeless. Will consult TOC      DVT prophylaxis: lovenox  Code Status: full  Family Communication:  Disposition Plan: likely d/c back home   Status is: Observation  The patient remains OBS appropriate and will d/c before 2 midnights.  Dispo: The patient is from: Home              Anticipated d/c is to: Home              Anticipated d/c date is: 1 day              Patient currently is not medically stable to d/c.     Consultants:   cardio   Procedures:    Antimicrobials:   Subjective: Pt c/o intermittent chest pain   Objective: Vitals:   03/27/20 0300 03/27/20 0600 03/27/20 0800 03/27/20 0813  BP: (!) 145/98 (!) 148/108 (!) 152/98 (!) 152/98  Pulse: 91 100 97   Resp: 16 18 17    Temp:      TempSrc:      SpO2: 98% 98% 96%    No intake or output data in the 24 hours ending 03/27/20 0829 There were no vitals filed for this visit.  Examination:  General exam: Appears calm and comfortable  Respiratory system: Clear to  auscultation. Respiratory effort normal. Cardiovascular system: S1 & S2+. No rubs, gallops or clicks.  Gastrointestinal system: Abdomen is nondistended, soft and nontender.  Normal bowel sounds heard. Central nervous system: Alert and oriented. Moves all 4 extremities  Psychiatry: Judgement and insight appear normal. Flat mood and affect     Data Reviewed: I have personally reviewed following labs and imaging studies  CBC: Recent Labs  Lab 03/26/20 0648  WBC 6.5  HGB 14.1  HCT 43.6  MCV 87.7  PLT 212   Basic Metabolic Panel: Recent Labs  Lab 03/26/20 0648  NA 136  K 4.2  CL 100  CO2 26  GLUCOSE 319*  BUN 30*  CREATININE 1.76*  CALCIUM 8.7*  MG 1.9   GFR: CrCl cannot be calculated (Unknown ideal weight.). Liver Function Tests: No results for input(s): AST, ALT, ALKPHOS, BILITOT, PROT, ALBUMIN in the last 168 hours. No results for input(s): LIPASE, AMYLASE in the last 168 hours. No results for input(s): AMMONIA in the last 168 hours. Coagulation Profile: No results for input(s): INR, PROTIME in the last 168 hours. Cardiac Enzymes: No results for input(s): CKTOTAL, CKMB, CKMBINDEX, TROPONINI in the last 168 hours. BNP (last 3 results) No results for input(s): PROBNP in the last 8760  hours. HbA1C: No results for input(s): HGBA1C in the last 72 hours. CBG: Recent Labs  Lab 03/26/20 1343 03/26/20 1831 03/26/20 2200 03/27/20 0146 03/27/20 0809  GLUCAP 295* 319* 345* 302* 280*   Lipid Profile: Recent Labs    03/27/20 0538  CHOL 248*  HDL 36*  LDLCALC 199*  TRIG 65  CHOLHDL 6.9   Thyroid Function Tests: No results for input(s): TSH, T4TOTAL, FREET4, T3FREE, THYROIDAB in the last 72 hours. Anemia Panel: No results for input(s): VITAMINB12, FOLATE, FERRITIN, TIBC, IRON, RETICCTPCT in the last 72 hours. Sepsis Labs: No results for input(s): PROCALCITON, LATICACIDVEN in the last 168 hours.  Recent Results (from the past 240 hour(s))  SARS CORONAVIRUS 2  (TAT 6-24 HRS) Nasopharyngeal Nasopharyngeal Swab     Status: None   Collection Time: 03/26/20 12:13 PM   Specimen: Nasopharyngeal Swab  Result Value Ref Range Status   SARS Coronavirus 2 NEGATIVE NEGATIVE Final    Comment: (NOTE) SARS-CoV-2 target nucleic acids are NOT DETECTED.  The SARS-CoV-2 RNA is generally detectable in upper and lower respiratory specimens during the acute phase of infection. Negative results do not preclude SARS-CoV-2 infection, do not rule out co-infections with other pathogens, and should not be used as the sole basis for treatment or other patient management decisions. Negative results must be combined with clinical observations, patient history, and epidemiological information. The expected result is Negative.  Fact Sheet for Patients: HairSlick.no  Fact Sheet for Healthcare Providers: quierodirigir.com  This test is not yet approved or cleared by the [place] FDA and  has been authorized for detection and/or diagnosis of SARS-CoV-2 by FDA under an Emergency Use Authorization (EUA). This EUA will remain  in effect (meaning this test can be used) for the duration of the COVID-19 declaration under Se ction 564(b)(1) of the Act, 21 U.S.C. section 360bbb-3(b)(1), unless the authorization is terminated or revoked sooner.  Performed at [facility], [address] [place], [place] [address]          Radiology Studies: No results found.      Scheduled Meds: . amLODipine  10 mg Oral Daily  . atorvastatin  20 mg Oral Daily  . clopidogrel  75 mg Oral Daily  . dorzolamide-timolol  1 drop Right Eye BID  . enoxaparin (LOVENOX) injection  30 mg Subcutaneous Q24H  . furosemide  40 mg Oral Daily  . insulin aspart  0-15 Units Subcutaneous TID AC & HS  . insulin aspart protamine- aspart  20 Units Subcutaneous BID WC  . isosorbide mononitrate  30 mg Oral Daily  . metoprolol succinate   50 mg Oral Daily  . nicotine  21 mg Transdermal Daily  . pantoprazole  40 mg Oral Daily  . spironolactone  25 mg Oral Daily  . tamsulosin  0.4 mg Oral Daily   Continuous Infusions:   LOS: 0 days    Time spent: 33 mins     Charise Killian, MD [facility] Pager [phone]  If 7PM-7AM, please contact night-coverage 03/27/2020, 8:29 AM

## 2020-03-27 NOTE — ED Notes (Signed)
Patient referred to this RN as "boo boo kitty" when checking blood glucose levels. Advised to patient that my name is Vernona Rieger, and this is how I prefer to be addressed. Also patient asked if my tattoo hurt. He then asked to get his billfold so he can "Make it rain." This RN advised patient that such behavior is not appropriate.

## 2020-03-27 NOTE — ED Notes (Signed)
Pt placed on 2L 

## 2020-03-27 NOTE — ED Notes (Signed)
bg 288

## 2020-03-27 NOTE — ED Notes (Signed)
Pt angry when this nurse walked in room. Pt stated he was using the urinal, no urinal noted in pts hand. Pt uncovered from waist down and on the phone. Pt states he gets no respect. This RN apologized and stated that I knocked as I entered. Pt states that was not true and threw phone in direction of nurse. This RN walked out of room without engaging pt further.

## 2020-03-28 DIAGNOSIS — E1169 Type 2 diabetes mellitus with other specified complication: Secondary | ICD-10-CM | POA: Diagnosis not present

## 2020-03-28 DIAGNOSIS — N1831 Chronic kidney disease, stage 3a: Secondary | ICD-10-CM | POA: Diagnosis not present

## 2020-03-28 DIAGNOSIS — Z794 Long term (current) use of insulin: Secondary | ICD-10-CM

## 2020-03-28 DIAGNOSIS — R079 Chest pain, unspecified: Secondary | ICD-10-CM | POA: Diagnosis not present

## 2020-03-28 DIAGNOSIS — N179 Acute kidney failure, unspecified: Secondary | ICD-10-CM | POA: Diagnosis not present

## 2020-03-28 LAB — BASIC METABOLIC PANEL
Anion gap: 10 (ref 5–15)
BUN: 26 mg/dL — ABNORMAL HIGH (ref 6–20)
CO2: 25 mmol/L (ref 22–32)
Calcium: 8.4 mg/dL — ABNORMAL LOW (ref 8.9–10.3)
Chloride: 101 mmol/L (ref 98–111)
Creatinine, Ser: 1.79 mg/dL — ABNORMAL HIGH (ref 0.61–1.24)
GFR, Estimated: 44 mL/min — ABNORMAL LOW (ref >60)
Glucose, Bld: 294 mg/dL — ABNORMAL HIGH (ref 70–99)
Potassium: 4 mmol/L (ref 3.5–5.1)
Sodium: 136 mmol/L (ref 135–145)

## 2020-03-28 LAB — ECHOCARDIOGRAM COMPLETE
AR max vel: 1.59 cm2
AV Area VTI: 1.67 cm2
AV Area mean vel: 1.76 cm2
AV Mean grad: 4 mmHg
AV Peak grad: 9.2 mmHg
Ao pk vel: 1.52 m/s
Area-P 1/2: 7.37 cm2
Calc EF: 28.8 %
MV VTI: 2.45 cm2
S' Lateral: 4.5 cm
Single Plane A2C EF: 34.3 %
Single Plane A4C EF: 26.8 %

## 2020-03-28 LAB — CBC
HCT: 42.1 % (ref 39.0–52.0)
Hemoglobin: 13.8 g/dL (ref 13.0–17.0)
MCH: 28 pg (ref 26.0–34.0)
MCHC: 32.8 g/dL (ref 30.0–36.0)
MCV: 85.6 fL (ref 80.0–100.0)
Platelets: 206 10*3/uL (ref 150–400)
RBC: 4.92 MIL/uL (ref 4.22–5.81)
RDW: 13 % (ref 11.5–15.5)
WBC: 6.3 10*3/uL (ref 4.0–10.5)
nRBC: 0 % (ref 0.0–0.2)

## 2020-03-28 LAB — CBG MONITORING, ED: Glucose-Capillary: 370 mg/dL — ABNORMAL HIGH (ref 70–99)

## 2020-03-28 MED ORDER — ISOSORBIDE MONONITRATE ER 30 MG PO TB24: 30.0000 mg | ORAL_TABLET | Freq: Every day | ORAL | 0 refills | Status: DC

## 2020-03-28 MED ORDER — NICOTINE 21 MG/24HR TD PT24: 21.0000 mg | MEDICATED_PATCH | Freq: Every day | TRANSDERMAL | 0 refills | Status: AC

## 2020-03-28 NOTE — TOC Initial Note (Signed)
Transition of Care [facility]) - Initial/Assessment Note    Patient Details  Name: Ahan Eisenberger MRN: [id number] Date of Birth: 04/17/1965  [facility] CM/SW Contact:    Elsmere Cellar, RN Phone Number: 03/28/2020, 1:43 PM  Clinical Narrative:                 Attempted to see patient in ED related to Homeless concerns however patient already discharged home. No answer to telephone call post discharge.         Patient Goals and CMS Choice        Expected Discharge Plan and Services           Expected Discharge Date: 03/28/20                                    Prior Living Arrangements/Services                       Activities of Daily Living      Permission Sought/Granted                  Emotional Assessment              Admission diagnosis:  Chest pain [R07.9] Patient Active Problem List   Diagnosis Date Noted   Chest pain 03/26/2020   Coronary artery disease    Acute renal failure superimposed on stage 3a chronic kidney disease (HCC)    Chronic systolic CHF (congestive heart failure) (HCC)    Hypertensive urgency    Elevated troponin    PCP:  Patient, No Pcp Per Pharmacy:   [facility] [address]), [place] - [address] [place]) [place] [address] Phone: [phone] Fax: [phone]     Social Determinants of Health (SDOH) Interventions    Readmission Risk Interventions No flowsheet data found.

## 2020-03-28 NOTE — Discharge Summary (Addendum)
Physician Discharge Summary  Mark Brooks SHF:[id number] DOB: 09-Oct-1965 DOA: 03/26/2020  PCP: Patient, No Pcp Per  Admit date: 03/26/2020 Discharge date: 03/28/2020  Admitted From: home Disposition: home  Recommendations for Outpatient Follow-up:  1. Follow up with PCP in 1 week & will need to get BMP to check Cr level 2. F/u cardio, Dr. Welton Flakes, 03/29/20 at 9:30AM  Home Health: no  Equipment/Devices:  Discharge Condition: stable  CODE STATUS: full  Diet recommendation: Heart Healthy / Carb Modified   Brief/Interim Summary: HPI was taken from Dr. Welton Flakes: Mark Brooks is a 55 y.o. male with medical history significant of hypertension, diabetes mellitus, CAD, myocardial infarction, stent placement, former smoker, CHF with a EF 30%, CKD stage IIIa, who presents with chest pain.  Patient states that his chest pain started at about 4 AM, which is located in central chest, moderate, pressure-like, radiating to the back and right shoulder.  Associated with mild shortness of breath.  No cough, fever or chills.  Patient denies diarrhea, nausea, vomiting, abdominal pain.  No symptoms of UTI or unilateral weakness.  ED Course: pt was found to have troponin level 62, 53, 48, pending COVID PCR, worsening renal function, temperature normal, blood pressure 180/107, 155/110, heart rate 90, RR 16, oxygen saturation 99% on room air.  Patient is placed on progressive for observation.  Dr. Welton Flakes of cardiology is consulted  Hospital Course from Dr. Mayford Knife 1/16-1/17/22: Pt presented w/ chest pain that atypical as per cardio. Troponins were initially minimally elevated but then started to trend down. Pt continued on metoprolol, plavix, imdur, statin, lasix & spironolactone as per cardio. Pt will f/u w/ cardio, Dr. Welton Flakes, on 03/29/20 at 9:30AM.  Of note, pt was found to have AKI on CKD. Pt was taking NSAIDs at home and NSAIDs were d/c and not continued at d/c. Pt will need to f/u w/ PCP or cardio to get BMP to check  Cr level. Pt verbalized his understanding.   Discharge Diagnoses:  Principal Problem:   Chest pain Active Problems:   Coronary artery disease   Acute renal failure superimposed on stage 3a chronic kidney disease (HCC)   Chronic systolic CHF (congestive heart failure) (HCC)   Hypertensive urgency   Elevated troponin  Chest pain: w/ minimally elevated troponins, likely secondary to demand ischemia. Chest pain resolved prior to d/c. Hx of CAD. Continue on tele. Cardio following and recs apprec. Continue on plavix, imdur, statin, spironolactone as per cardio. Echo shows EF 25-30% & grade III diastolic dysfunction. Repeat echo & stress test will be done outpatient as per cardio. Nitro, morphine prn for pain    AKI on CKDIIIa: baseline Cr around 1.3. Continue to hold home dose of lisinopril  DM2: poorly controlled w/ HbA1c 12.6. Continue on novolog, SSI w/ accuchecks   Chronic systolic CHF: echo 01/13/2017 showed a EF of 30%. Appears euvolemic. Continue on home dose of metoprolol, lasix, spironolactone   Hypertensive urgency: continue on increased dose of amlodipine. Continue on metoprolol, lasix, imdur & spironolactone. HTN urgency resolved prior to d/c.  Social issues: pt is homeless but refuses to go to a homeless shelter   Discharge Instructions  Discharge Instructions    Diet - low sodium heart healthy   Complete by: As directed    Diet Carb Modified   Complete by: As directed    Discharge instructions   Complete by: As directed    F/u PCP within 1 week & will need BMP to check Cr/kidney function. F/u w/ cardio,  Dr. Welton Flakes, on 03/29/20 at 9:30 AM. Do not take any NSAIDs/meloxicam as this can cause kidney damage   Increase activity slowly   Complete by: As directed      Allergies as of 03/28/2020   No Known Allergies     Medication List    STOP taking these medications   meloxicam 15 MG tablet Commonly known as: MOBIC     TAKE these medications   amLODipine 5 MG  tablet Commonly known as: NORVASC Take 5 mg by mouth daily.   atorvastatin 20 MG tablet Commonly known as: LIPITOR Take 20 mg by mouth daily.   clopidogrel 75 MG tablet Commonly known as: PLAVIX Take 75 mg by mouth daily.   dorzolamide-timolol 22.3-6.8 MG/ML ophthalmic solution Commonly known as: COSOPT Place 1 drop into the right eye 2 (two) times daily.   furosemide 40 MG tablet Commonly known as: LASIX Take 40 mg by mouth daily.   insulin NPH-regular Human (70-30) 100 UNIT/ML injection Inject 20-25 Units into the skin 2 (two) times daily. (based upon blood sugar reading)   isosorbide mononitrate 30 MG 24 hr tablet Commonly known as: IMDUR Take 1 tablet (30 mg total) by mouth daily.   lisinopril 40 MG tablet Commonly known as: ZESTRIL Take 40 mg by mouth daily.   metFORMIN 500 MG 24 hr tablet Commonly known as: GLUCOPHAGE-XR Take 500 mg by mouth 2 (two) times daily.   metoprolol succinate 50 MG 24 hr tablet Commonly known as: TOPROL-XL Take 50 mg by mouth daily.   omeprazole 20 MG tablet Commonly known as: PRILOSEC OTC Take 20 mg by mouth daily.   spironolactone 25 MG tablet Commonly known as: ALDACTONE Take 25 mg by mouth daily.   tamsulosin 0.4 MG Caps capsule Commonly known as: FLOMAX Take 0.4 mg by mouth daily.       Follow-up Information    Laurier Nancy, MD. Go to.   Specialty: Cardiology Why: 03/29/20 at 9:30 AM Contact information: [address] [place] [address] [phone]              No Known Allergies  Consultations: Cardio, Dr. Welton Flakes   Procedures/Studies: ECHOCARDIOGRAM COMPLETE  Result Date: 03/28/2020    ECHOCARDIOGRAM REPORT   Patient Name:   Mark Brooks Date of Exam: 03/27/2020 Medical Rec #:  [id number]     Height:       68.0 in Accession #:    [id number]    Weight:       176.0 lb Date of Birth:  01-26-1966     BSA:          1.936 m Patient Age:    54 years      BP:           148/108 mmHg Patient Gender: M              HR:           96 bpm. Exam Location:  [facility] Procedure: Color Doppler and Cardiac Doppler Indications:     R07.9 Chest Pain  History:         Patient has no prior history of Echocardiogram examinations.                  CAD and Previous Myocardial Infarction. Two stents placed                  05/2019.  Sonographer:     Humphrey Rolls RDCS (AE) Referring Phys:  Kern Reap Lorretta Harp  Diagnosing Phys: Adrian BlackwaterShaukat Khan MD IMPRESSIONS  1. Left ventricular ejection fraction, by estimation, is 25 to 30%. The left ventricle has severely decreased function. The left ventricle demonstrates global hypokinesis. The left ventricular internal cavity size was severely dilated. There is moderate  concentric left ventricular hypertrophy. Left ventricular diastolic parameters are consistent with Grade III diastolic dysfunction (restrictive).  2. Right ventricular systolic function is moderately reduced. The right ventricular size is moderately enlarged. Mildly increased right ventricular wall thickness.  3. Left atrial size was severely dilated.  4. Right atrial size was severely dilated.  5. The mitral valve is normal in structure. Mild mitral valve regurgitation. No evidence of mitral stenosis.  6. The aortic valve is normal in structure. Aortic valve regurgitation is not visualized. No aortic stenosis is present.  7. The inferior vena cava is normal in size with greater than 50% respiratory variability, suggesting right atrial pressure of 3 mmHg. Conclusion(s)/Recommendation(s): Findings consistent with ischemic cardiomyopathy. FINDINGS  Left Ventricle: Left ventricular ejection fraction, by estimation, is 25 to 30%. The left ventricle has severely decreased function. The left ventricle demonstrates global hypokinesis. The left ventricular internal cavity size was severely dilated. There is moderate concentric left ventricular hypertrophy. Left ventricular diastolic parameters are consistent with Grade III diastolic dysfunction (restrictive).  Right Ventricle: The right ventricular size is moderately enlarged. Mildly increased right ventricular wall thickness. Right ventricular systolic function is moderately reduced. Left Atrium: Left atrial size was severely dilated. Right Atrium: Right atrial size was severely dilated. Pericardium: There is no evidence of pericardial effusion. Mitral Valve: The mitral valve is normal in structure. Mild mitral valve regurgitation. No evidence of mitral valve stenosis. MV peak gradient, 5.0 mmHg. The mean mitral valve gradient is 3.0 mmHg. Tricuspid Valve: The tricuspid valve is normal in structure. Tricuspid valve regurgitation is mild . No evidence of tricuspid stenosis. Aortic Valve: The aortic valve is normal in structure. Aortic valve regurgitation is not visualized. No aortic stenosis is present. Aortic valve mean gradient measures 4.0 mmHg. Aortic valve peak gradient measures 9.2 mmHg. Aortic valve area, by VTI measures 1.67 cm. Pulmonic Valve: The pulmonic valve was normal in structure. Pulmonic valve regurgitation is mild. No evidence of pulmonic stenosis. Aorta: The aortic root was not well visualized. Venous: The inferior vena cava is normal in size with greater than 50% respiratory variability, suggesting right atrial pressure of 3 mmHg. IAS/Shunts: No atrial level shunt detected by color flow Doppler.  LEFT VENTRICLE PLAX 2D LVIDd:         5.40 cm      Diastology LVIDs:         4.50 cm      LV e' medial:    2.83 cm/s LV PW:         1.10 cm      LV E/e' medial:  34.8 LV IVS:        1.00 cm      LV e' lateral:   5.87 cm/s LVOT diam:     2.00 cm      LV E/e' lateral: 16.8 LV SV:         42 LV SV Index:   22 LVOT Area:     3.14 cm  LV Volumes (MOD) LV vol d, MOD A2C: 113.0 ml LV vol d, MOD A4C: 111.0 ml LV vol s, MOD A2C: 74.2 ml LV vol s, MOD A4C: 81.2 ml LV SV MOD A2C:     38.8 ml LV SV MOD A4C:  111.0 ml LV SV MOD BP:      32.5 ml RIGHT VENTRICLE RV Basal diam:  2.60 cm TAPSE (M-mode): 2.1 cm LEFT ATRIUM              Index       RIGHT ATRIUM           Index LA diam:        4.20 cm 2.17 cm/m  RA Area:     12.00 cm LA Vol (A2C):   47.2 ml 24.38 ml/m RA Volume:   26.10 ml  13.48 ml/m LA Vol (A4C):   32.3 ml 16.69 ml/m LA Biplane Vol: 41.3 ml 21.34 ml/m  AORTIC VALVE                   PULMONIC VALVE AV Area (Vmax):    1.59 cm    PV Vmax:       0.92 m/s AV Area (Vmean):   1.76 cm    PV Vmean:      60.300 cm/s AV Area (VTI):     1.67 cm    PV VTI:        0.160 m AV Vmax:           152.00 cm/s PV Peak grad:  3.4 mmHg AV Vmean:          99.200 cm/s PV Mean grad:  2.0 mmHg AV VTI:            0.252 m AV Peak Grad:      9.2 mmHg AV Mean Grad:      4.0 mmHg LVOT Vmax:         77.00 cm/s LVOT Vmean:        55.600 cm/s LVOT VTI:          0.134 m LVOT/AV VTI ratio: 0.53  AORTA Ao Root diam: 3.90 cm MITRAL VALVE MV Area (PHT): 7.37 cm    SHUNTS MV Area VTI:   2.45 cm    Systemic VTI:  0.13 m MV Peak grad:  5.0 mmHg    Systemic Diam: 2.00 cm MV Mean grad:  3.0 mmHg MV Vmax:       1.12 m/s MV Vmean:      73.0 cm/s MV Decel Time: 103 msec MV E velocity: 98.50 cm/s MV A velocity: 64.30 cm/s MV E/A ratio:  1.53 Adrian Blackwater MD Electronically signed by Adrian Blackwater MD Signature Date/Time: 03/28/2020/9:31:31 AM    Final     (Echo, Carotid, EGD, Colonoscopy, ERCP)    Subjective:   Discharge Exam: Vitals:   03/28/20 0730 03/28/20 0842  BP:  (!) 149/102  Pulse: 89 88  Resp:  18  Temp:  98.8 F (37.1 C)  SpO2: 98% 98%   Vitals:   03/28/20 0421 03/28/20 0656 03/28/20 0730 03/28/20 0842  BP:  (!) 161/109  (!) 149/102  Pulse: 88 89 89 88  Resp: 20 20  18   Temp:    98.8 F (37.1 C)  TempSrc:    Oral  SpO2: 97% 96% 98% 98%    General: Pt is alert, awake, not in acute distress Cardiovascular:S1/S2 +, no rubs, no gallops Respiratory: CTA bilaterally, no wheezing, no rhonchi Abdominal: Soft, NT, ND, bowel sounds + Extremities: no cyanosis    The results of significant diagnostics from this  hospitalization (including imaging, microbiology, ancillary and laboratory) are listed below for reference.     Microbiology: Recent Results (from the past 240 hour(s))  SARS CORONAVIRUS 2 (  TAT 6-24 HRS) Nasopharyngeal Nasopharyngeal Swab     Status: None   Collection Time: 03/26/20 12:13 PM   Specimen: Nasopharyngeal Swab  Result Value Ref Range Status   SARS Coronavirus 2 NEGATIVE NEGATIVE Final    Comment: (NOTE) SARS-CoV-2 target nucleic acids are NOT DETECTED.  The SARS-CoV-2 RNA is generally detectable in upper and lower respiratory specimens during the acute phase of infection. Negative results do not preclude SARS-CoV-2 infection, do not rule out co-infections with other pathogens, and should not be used as the sole basis for treatment or other patient management decisions. Negative results must be combined with clinical observations, patient history, and epidemiological information. The expected result is Negative.  Fact Sheet for Patients: HairSlick.no  Fact Sheet for Healthcare Providers: quierodirigir.com  This test is not yet approved or cleared by the [place] FDA and  has been authorized for detection and/or diagnosis of SARS-CoV-2 by FDA under an Emergency Use Authorization (EUA). This EUA will remain  in effect (meaning this test can be used) for the duration of the COVID-19 declaration under Se ction 564(b)(1) of the Act, 21 U.S.C. section 360bbb-3(b)(1), unless the authorization is terminated or revoked sooner.  Performed at [facility], [address] [place], [place] [address]      Labs: BNP (last 3 results) Recent Labs    03/26/20 0648  BNP 1,051.3*   Basic Metabolic Panel: Recent Labs  Lab 03/26/20 0648 03/27/20 1119 03/28/20 0330  NA 136 134* 136  K 4.2 4.0 4.0  CL 100 102 101  CO2 26 24 25   GLUCOSE 319* 218* 294*  BUN 30* 26* 26*  CREATININE 1.76* 1.73* 1.79*   CALCIUM 8.7* 8.6* 8.4*  MG 1.9  --   --    Liver Function Tests: No results for input(s): AST, ALT, ALKPHOS, BILITOT, PROT, ALBUMIN in the last 168 hours. No results for input(s): LIPASE, AMYLASE in the last 168 hours. No results for input(s): AMMONIA in the last 168 hours. CBC: Recent Labs  Lab 03/26/20 0648 03/27/20 1119 03/28/20 0330  WBC 6.5 9.2 6.3  HGB 14.1 14.3 13.8  HCT 43.6 42.1 42.1  MCV 87.7 84.4 85.6  PLT 212 214 206   Cardiac Enzymes: No results for input(s): CKTOTAL, CKMB, CKMBINDEX, TROPONINI in the last 168 hours. BNP: Invalid input(s): POCBNP CBG: Recent Labs  Lab 03/27/20 0809 03/27/20 1344 03/27/20 1711 03/27/20 2321 03/28/20 0825  GLUCAP 280* 379* 288* 165* 370*   D-Dimer No results for input(s): DDIMER in the last 72 hours. Hgb A1c Recent Labs    03/27/20 0538  HGBA1C 12.6*   Lipid Profile Recent Labs    03/27/20 0538  CHOL 248*  HDL 36*  LDLCALC 199*  TRIG 65  CHOLHDL 6.9   Thyroid function studies No results for input(s): TSH, T4TOTAL, T3FREE, THYROIDAB in the last 72 hours.  Invalid input(s): FREET3 Anemia work up No results for input(s): VITAMINB12, FOLATE, FERRITIN, TIBC, IRON, RETICCTPCT in the last 72 hours. Urinalysis No results found for: COLORURINE, APPEARANCEUR, LABSPEC, PHURINE, GLUCOSEU, HGBUR, BILIRUBINUR, KETONESUR, PROTEINUR, UROBILINOGEN, NITRITE, LEUKOCYTESUR Sepsis Labs Invalid input(s): PROCALCITONIN,  WBC,  LACTICIDVEN Microbiology Recent Results (from the past 240 hour(s))  SARS CORONAVIRUS 2 (TAT 6-24 HRS) Nasopharyngeal Nasopharyngeal Swab     Status: None   Collection Time: 03/26/20 12:13 PM   Specimen: Nasopharyngeal Swab  Result Value Ref Range Status   SARS Coronavirus 2 NEGATIVE NEGATIVE Final    Comment: (NOTE) SARS-CoV-2 target nucleic acids are NOT  DETECTED.  The SARS-CoV-2 RNA is generally detectable in upper and lower respiratory specimens during the acute phase of infection.  Negative results do not preclude SARS-CoV-2 infection, do not rule out co-infections with other pathogens, and should not be used as the sole basis for treatment or other patient management decisions. Negative results must be combined with clinical observations, patient history, and epidemiological information. The expected result is Negative.  Fact Sheet for Patients: HairSlick.nohttps://www.fda.gov/media/138098/download  Fact Sheet for Healthcare Providers: quierodirigir.comhttps://www.fda.gov/media/138095/download  This test is not yet approved or cleared by the [place] FDA and  has been authorized for detection and/or diagnosis of SARS-CoV-2 by FDA under an Emergency Use Authorization (EUA). This EUA will remain  in effect (meaning this test can be used) for the duration of the COVID-19 declaration under Se ction 564(b)(1) of the Act, 21 U.S.C. section 360bbb-3(b)(1), unless the authorization is terminated or revoked sooner.  Performed at [facility], [address] [place], [place] [address]      Time coordinating discharge: Over 30 minutes  SIGNED:   Charise KillianJamiese M Kellyn Mccary, MD  [facility] 03/28/2020, 11:00 AM Pager   If 7PM-7AM, please contact night-coverage

## 2020-03-28 NOTE — Progress Notes (Signed)
SUBJECTIVE: Patient has occasional tightness in the chest   Vitals:   03/28/20 0421 03/28/20 0656 03/28/20 0730 03/28/20 0842  BP:  (!) 161/109  (!) 149/102  Pulse: 88 89 89 88  Resp: 20 20  18   Temp:    98.8 F (37.1 C)  TempSrc:    Oral  SpO2: 97% 96% 98% 98%    Intake/Output Summary (Last 24 hours) at 03/28/2020 0937 Last data filed at 03/27/2020 1736 Gross per 24 hour  Intake --  Output 1650 ml  Net -1650 ml    LABS: Basic Metabolic Panel: Recent Labs    03/26/20 0648 03/27/20 1119 03/28/20 0330  NA 136 134* 136  K 4.2 4.0 4.0  CL 100 102 101  CO2 26 24 25   GLUCOSE 319* 218* 294*  BUN 30* 26* 26*  CREATININE 1.76* 1.73* 1.79*  CALCIUM 8.7* 8.6* 8.4*  MG 1.9  --   --    Liver Function Tests: No results for input(s): AST, ALT, ALKPHOS, BILITOT, PROT, ALBUMIN in the last 72 hours. No results for input(s): LIPASE, AMYLASE in the last 72 hours. CBC: Recent Labs    03/27/20 1119 03/28/20 0330  WBC 9.2 6.3  HGB 14.3 13.8  HCT 42.1 42.1  MCV 84.4 85.6  PLT 214 206   Cardiac Enzymes: No results for input(s): CKTOTAL, CKMB, CKMBINDEX, TROPONINI in the last 72 hours. BNP: Invalid input(s): POCBNP D-Dimer: No results for input(s): DDIMER in the last 72 hours. Hemoglobin A1C: Recent Labs    03/27/20 0538  HGBA1C 12.6*   Fasting Lipid Panel: Recent Labs    03/27/20 0538  CHOL 248*  HDL 36*  LDLCALC 199*  TRIG 65  CHOLHDL 6.9   Thyroid Function Tests: No results for input(s): TSH, T4TOTAL, T3FREE, THYROIDAB in the last 72 hours.  Invalid input(s): FREET3 Anemia Panel: No results for input(s): VITAMINB12, FOLATE, FERRITIN, TIBC, IRON, RETICCTPCT in the last 72 hours.   PHYSICAL EXAM General: Well developed, well nourished, in no acute distress HEENT:  Normocephalic and atramatic Neck:  No JVD.  Lungs: Clear bilaterally to auscultation and percussion. Heart: HRRR . Normal S1 and S2 without gallops or murmurs.  Abdomen: Bowel sounds are  positive, abdomen soft and non-tender  Msk:  Back normal, normal gait. Normal strength and tone for age. Extremities: No clubbing, cyanosis or edema.   Neuro: Alert and oriented X 3. Psych:  Good affect, responds appropriately  TELEMETRY: Sinus rhythm  ASSESSMENT AND PLAN: Chest pain with MI ruled out and cardiomyopathy with severe LV dysfunction but he also has renal failure.  Patient can be started on hydralazine 25 p.o. twice daily along with isosorbide.  Patient can be discharged with follow-up in the office Tuesday at 10 AM.  Principal Problem:   Chest pain Active Problems:   Coronary artery disease   Acute renal failure superimposed on stage 3a chronic kidney disease (HCC)   Chronic systolic CHF (congestive heart failure) (HCC)   Hypertensive urgency   Elevated troponin    03/29/20 A, MD, [facility] 03/28/2020 9:37 AM

## 2020-03-28 NOTE — ED Notes (Signed)
Consulting MD at bedside, states that pt can be d/c'd today and follow up outpatient cardiology

## 2020-03-28 NOTE — Progress Notes (Signed)
Inpatient Diabetes Program Recommendations  AACE/ADA: New Consensus Statement on Inpatient Glycemic Control (2015)  Target Ranges:  Prepandial:   less than 140 mg/dL      Peak postprandial:   less than 180 mg/dL (1-2 hours)      Critically ill patients:  140 - 180 mg/dL   Results for Mark Brooks, Mark Brooks (MRN [id number]) as of 03/28/2020 08:41  Ref. Range 03/26/2020 13:43 03/26/2020 18:31 03/26/2020 22:00 03/27/2020 01:46 03/27/2020 08:09 03/27/2020 13:44 03/27/2020 17:11 03/27/2020 23:21  Glucose-Capillary Latest Ref Range: 70 - 99 mg/dL 258 (H)  5 units NOVOLOG  319 (H)  11 units NOVOLOG  345 (H)  4 units NOVOLOG  302 (H)  10 units NOVOLOG @3 :07am 280 (H)  8 units NOVOLOG    20 units 70/30 Insulin  379 (H)  11 units NOVOLOG  288 (H)  8 units NOVOLOG    20 units 70/30 Insulin  165 (H)  3 units NOVOLOG    Results for [name], [name] (MRN Darlyne Russian) as of 03/28/2020 08:41  Ref. Range 03/28/2020 08:25  Glucose-Capillary Latest Ref Range: 70 - 99 mg/dL 03/30/2020 (H)  20 units 536 Insulin   Results for [name], [name] (MRN Darlyne Russian) as of 03/28/2020 08:41  Ref. Range 03/27/2020 05:38  Hemoglobin A1C Latest Ref Range: 4.8 - 5.6 % 12.6 (H)  (314 mg/dl)    Admit with: CP w/ elevated troponins  History: DM, CHF, CKD  Home DM Meds: 70/30 Insulin 20-25 units BID       Metformin 500 mg BID  Current Orders:70/30 Insulin 20 units BID     Novolog Moderate Correction Scale/ SSI (0-15 units) TID AC + HS    PCP listed as [facility]--last seen 12/07/2019--was supposed to be taking 70/30 Insulin 40 units BID + Metformin 500 mg BID--A1c at that visit was 14% Has Medicaid listed as insurance coverage MD notes state pt is homeless   MD- Please consider increasing the 70/30 Insulin to 30 units BID      --Will follow patient during hospitalization--  12/09/2019 RN, MSN, CDE Diabetes Coordinator Inpatient Glycemic Control  Team Team Pager: [phone] (8a-5p)

## 2020-03-28 NOTE — ED Notes (Signed)
Pt reports numbness and swelling to feet. Pt states he started noticing the swelling this AM. Upon inspection, bilateral feet have very mild swelling. Non-pitting, barely a +1, very slightly more so on left near forefoot.   Pt reports progressive numbness "for a while now" in toes. States very little feeling  Mentions recent prescription for gabapentin, wants to receive here as well  Pt to bathroom with x2 loose BM

## 2021-12-13 ENCOUNTER — Inpatient Hospital Stay
Admission: EM | Admit: 2021-12-13 | Discharge: 2021-12-15 | DRG: 291 | Disposition: A | Discharge status: Home / Self Care | Payer: Medicare Other | Attending: Osteopathic Medicine | Admitting: Osteopathic Medicine

## 2021-12-13 ENCOUNTER — Other Ambulatory Visit: Payer: Self-pay

## 2021-12-13 ENCOUNTER — Observation Stay
Admit: 2021-12-13 | Discharge: 2021-12-13 | Disposition: A | Discharge status: Home / Self Care | Payer: Medicare Other | Attending: Internal Medicine | Admitting: Internal Medicine

## 2021-12-13 ENCOUNTER — Encounter: Payer: Self-pay | Admitting: Intensive Care

## 2021-12-13 ENCOUNTER — Emergency Department: Payer: Medicare Other

## 2021-12-13 DIAGNOSIS — Z833 Family history of diabetes mellitus: Secondary | ICD-10-CM

## 2021-12-13 DIAGNOSIS — Z79899 Other long term (current) drug therapy: Secondary | ICD-10-CM

## 2021-12-13 DIAGNOSIS — I130 Hypertensive heart and chronic kidney disease with heart failure and stage 1 through stage 4 chronic kidney disease, or unspecified chronic kidney disease: Principal | ICD-10-CM | POA: Diagnosis present

## 2021-12-13 DIAGNOSIS — R0602 Shortness of breath: Secondary | ICD-10-CM

## 2021-12-13 DIAGNOSIS — E1165 Type 2 diabetes mellitus with hyperglycemia: Secondary | ICD-10-CM | POA: Diagnosis present

## 2021-12-13 DIAGNOSIS — I5023 Acute on chronic systolic (congestive) heart failure: Secondary | ICD-10-CM | POA: Diagnosis not present

## 2021-12-13 DIAGNOSIS — E1122 Type 2 diabetes mellitus with diabetic chronic kidney disease: Secondary | ICD-10-CM | POA: Diagnosis present

## 2021-12-13 DIAGNOSIS — Z8249 Family history of ischemic heart disease and other diseases of the circulatory system: Secondary | ICD-10-CM

## 2021-12-13 DIAGNOSIS — I509 Heart failure, unspecified: Secondary | ICD-10-CM

## 2021-12-13 DIAGNOSIS — R739 Hyperglycemia, unspecified: Secondary | ICD-10-CM

## 2021-12-13 DIAGNOSIS — R7989 Other specified abnormal findings of blood chemistry: Secondary | ICD-10-CM

## 2021-12-13 DIAGNOSIS — Z794 Long term (current) use of insulin: Secondary | ICD-10-CM

## 2021-12-13 DIAGNOSIS — I5043 Acute on chronic combined systolic (congestive) and diastolic (congestive) heart failure: Secondary | ICD-10-CM | POA: Diagnosis present

## 2021-12-13 DIAGNOSIS — F17210 Nicotine dependence, cigarettes, uncomplicated: Secondary | ICD-10-CM | POA: Diagnosis present

## 2021-12-13 DIAGNOSIS — Z1152 Encounter for screening for COVID-19: Secondary | ICD-10-CM

## 2021-12-13 DIAGNOSIS — I2510 Atherosclerotic heart disease of native coronary artery without angina pectoris: Secondary | ICD-10-CM | POA: Diagnosis present

## 2021-12-13 DIAGNOSIS — I252 Old myocardial infarction: Secondary | ICD-10-CM

## 2021-12-13 DIAGNOSIS — Z955 Presence of coronary angioplasty implant and graft: Secondary | ICD-10-CM

## 2021-12-13 DIAGNOSIS — K922 Gastrointestinal hemorrhage, unspecified: Secondary | ICD-10-CM | POA: Diagnosis present

## 2021-12-13 DIAGNOSIS — T383X6A Underdosing of insulin and oral hypoglycemic [antidiabetic] drugs, initial encounter: Secondary | ICD-10-CM | POA: Diagnosis present

## 2021-12-13 DIAGNOSIS — Z7902 Long term (current) use of antithrombotics/antiplatelets: Secondary | ICD-10-CM

## 2021-12-13 DIAGNOSIS — Z91128 Patient's intentional underdosing of medication regimen for other reason: Secondary | ICD-10-CM

## 2021-12-13 DIAGNOSIS — N400 Enlarged prostate without lower urinary tract symptoms: Secondary | ICD-10-CM | POA: Diagnosis present

## 2021-12-13 DIAGNOSIS — N1832 Chronic kidney disease, stage 3b: Secondary | ICD-10-CM | POA: Diagnosis present

## 2021-12-13 DIAGNOSIS — Z7984 Long term (current) use of oral hypoglycemic drugs: Secondary | ICD-10-CM

## 2021-12-13 DIAGNOSIS — E1151 Type 2 diabetes mellitus with diabetic peripheral angiopathy without gangrene: Secondary | ICD-10-CM | POA: Diagnosis present

## 2021-12-13 DIAGNOSIS — I5022 Chronic systolic (congestive) heart failure: Secondary | ICD-10-CM | POA: Diagnosis present

## 2021-12-13 DIAGNOSIS — E785 Hyperlipidemia, unspecified: Secondary | ICD-10-CM | POA: Diagnosis present

## 2021-12-13 DIAGNOSIS — E7800 Pure hypercholesterolemia, unspecified: Secondary | ICD-10-CM | POA: Diagnosis present

## 2021-12-13 HISTORY — DX: Type 2 diabetes mellitus without complications: E11.9

## 2021-12-13 HISTORY — DX: Pure hypercholesterolemia, unspecified: E78.00

## 2021-12-13 HISTORY — DX: Essential (primary) hypertension: I10

## 2021-12-13 LAB — CBC
HCT: 37.8 % — ABNORMAL LOW (ref 39.0–52.0)
Hemoglobin: 12.3 g/dL — ABNORMAL LOW (ref 13.0–17.0)
MCH: 28.6 pg (ref 26.0–34.0)
MCHC: 32.5 g/dL (ref 30.0–36.0)
MCV: 87.9 fL (ref 80.0–100.0)
Platelets: 190 10*3/uL (ref 150–400)
RBC: 4.3 MIL/uL (ref 4.22–5.81)
RDW: 12 % (ref 11.5–15.5)
WBC: 6.6 10*3/uL (ref 4.0–10.5)
nRBC: 0 % (ref 0.0–0.2)

## 2021-12-13 LAB — GLUCOSE, CAPILLARY
Glucose-Capillary: 310 mg/dL — ABNORMAL HIGH (ref 70–99)
Glucose-Capillary: 463 mg/dL — ABNORMAL HIGH (ref 70–99)

## 2021-12-13 LAB — BASIC METABOLIC PANEL
Anion gap: 7 (ref 5–15)
BUN: 27 mg/dL — ABNORMAL HIGH (ref 6–20)
CO2: 24 mmol/L (ref 22–32)
Calcium: 8.2 mg/dL — ABNORMAL LOW (ref 8.9–10.3)
Chloride: 102 mmol/L (ref 98–111)
Creatinine, Ser: 2.35 mg/dL — ABNORMAL HIGH (ref 0.61–1.24)
GFR, Estimated: 32 mL/min — ABNORMAL LOW (ref >60)
Glucose, Bld: 504 mg/dL (ref 70–99)
Potassium: 4.3 mmol/L (ref 3.5–5.1)
Sodium: 133 mmol/L — ABNORMAL LOW (ref 135–145)

## 2021-12-13 LAB — CBG MONITORING, ED
Glucose-Capillary: 441 mg/dL — ABNORMAL HIGH (ref 70–99)
Glucose-Capillary: 469 mg/dL — ABNORMAL HIGH (ref 70–99)

## 2021-12-13 LAB — RESP PANEL BY RT-PCR (FLU A&B, COVID) ARPGX2
Influenza A by PCR: NEGATIVE
Influenza B by PCR: NEGATIVE
SARS Coronavirus 2 by RT PCR: NEGATIVE

## 2021-12-13 LAB — MAGNESIUM: Magnesium: 2 mg/dL (ref 1.7–2.4)

## 2021-12-13 LAB — LACTIC ACID, PLASMA: Lactic Acid, Venous: 1.7 mmol/L (ref 0.5–1.9)

## 2021-12-13 LAB — TROPONIN I (HIGH SENSITIVITY)
Troponin I (High Sensitivity): 83 ng/L — ABNORMAL HIGH (ref <18)
Troponin I (High Sensitivity): 78 ng/L — ABNORMAL HIGH (ref <18)

## 2021-12-13 LAB — PROTIME-INR
INR: 1.1 (ref 0.8–1.2)
Prothrombin Time: 13.7 seconds (ref 11.4–15.2)

## 2021-12-13 LAB — BRAIN NATRIURETIC PEPTIDE: B Natriuretic Peptide: 3426.3 pg/mL — ABNORMAL HIGH (ref 0.0–100.0)

## 2021-12-13 MED ORDER — HEPARIN SODIUM (PORCINE) 5000 UNIT/ML IJ SOLN
5000.0000 [IU] | Freq: Three times a day (TID) | INTRAMUSCULAR | Status: DC
  Administered 2021-12-13 – 2021-12-15 (×5): 5000 [IU] via SUBCUTANEOUS
  Filled 2021-12-13 (×5): qty 1

## 2021-12-13 MED ORDER — INSULIN ASPART 100 UNIT/ML IJ SOLN
8.0000 [IU] | Freq: Once | INTRAMUSCULAR | Status: AC
  Administered 2021-12-13: 8 [IU] via INTRAVENOUS
  Filled 2021-12-13: qty 1

## 2021-12-13 MED ORDER — PANTOPRAZOLE SODIUM 40 MG PO TBEC
40.0000 mg | DELAYED_RELEASE_TABLET | Freq: Every day | ORAL | Status: DC
  Administered 2021-12-14 – 2021-12-15 (×2): 40 mg via ORAL
  Filled 2021-12-13 (×2): qty 1

## 2021-12-13 MED ORDER — ATORVASTATIN CALCIUM 20 MG PO TABS: 20.0000 mg | ORAL_TABLET | Freq: Every day | ORAL | Status: DC

## 2021-12-13 MED ORDER — OMEPRAZOLE MAGNESIUM 20 MG PO TBEC: 20.0000 mg | DELAYED_RELEASE_TABLET | Freq: Every day | ORAL | Status: DC

## 2021-12-13 MED ORDER — EZETIMIBE 10 MG PO TABS
10.0000 mg | ORAL_TABLET | Freq: Every day | ORAL | Status: DC
  Administered 2021-12-14 – 2021-12-15 (×2): 10 mg via ORAL
  Filled 2021-12-13 (×2): qty 1

## 2021-12-13 MED ORDER — ROSUVASTATIN CALCIUM 10 MG PO TABS
40.0000 mg | ORAL_TABLET | Freq: Every day | ORAL | Status: DC
  Administered 2021-12-13 – 2021-12-14 (×2): 40 mg via ORAL
  Filled 2021-12-13 (×4): qty 4

## 2021-12-13 MED ORDER — CARVEDILOL 12.5 MG PO TABS
12.5000 mg | ORAL_TABLET | Freq: Two times a day (BID) | ORAL | Status: DC
  Administered 2021-12-13 – 2021-12-15 (×4): 12.5 mg via ORAL
  Filled 2021-12-13 (×4): qty 1

## 2021-12-13 MED ORDER — SACUBITRIL-VALSARTAN 49-51 MG PO TABS
1.0000 | ORAL_TABLET | Freq: Two times a day (BID) | ORAL | Status: DC
  Administered 2021-12-13 – 2021-12-14 (×2): 1 via ORAL
  Filled 2021-12-13 (×2): qty 1

## 2021-12-13 MED ORDER — FUROSEMIDE 10 MG/ML IJ SOLN
40.0000 mg | Freq: Two times a day (BID) | INTRAMUSCULAR | Status: AC
  Administered 2021-12-13 – 2021-12-14 (×2): 40 mg via INTRAVENOUS
  Filled 2021-12-13 (×2): qty 4

## 2021-12-13 MED ORDER — ACETAMINOPHEN 650 MG RE SUPP: 650.0000 mg | Freq: Four times a day (QID) | RECTAL | Status: DC | PRN

## 2021-12-13 MED ORDER — INSULIN ASPART 100 UNIT/ML IJ SOLN
10.0000 [IU] | Freq: Once | INTRAMUSCULAR | Status: AC
  Administered 2021-12-13: 10 [IU] via SUBCUTANEOUS
  Filled 2021-12-13: qty 1

## 2021-12-13 MED ORDER — ONDANSETRON HCL 4 MG/2ML IJ SOLN: 4.0000 mg | Freq: Four times a day (QID) | INTRAMUSCULAR | Status: DC | PRN

## 2021-12-13 MED ORDER — ONDANSETRON HCL 4 MG PO TABS: 4.0000 mg | ORAL_TABLET | Freq: Four times a day (QID) | ORAL | Status: DC | PRN

## 2021-12-13 MED ORDER — ACETAMINOPHEN 325 MG PO TABS: 650.0000 mg | ORAL_TABLET | Freq: Four times a day (QID) | ORAL | Status: DC | PRN

## 2021-12-13 MED ORDER — SODIUM CHLORIDE 0.9 % IV BOLUS
1000.0000 mL | Freq: Once | INTRAVENOUS | Status: AC
  Administered 2021-12-13: 1000 mL via INTRAVENOUS

## 2021-12-13 MED ORDER — PERFLUTREN LIPID MICROSPHERE
1.0000 mL | INTRAVENOUS | Status: AC | PRN
  Administered 2021-12-13: 3 mL via INTRAVENOUS

## 2021-12-13 MED ORDER — SENNOSIDES-DOCUSATE SODIUM 8.6-50 MG PO TABS: 1.0000 | ORAL_TABLET | Freq: Every evening | ORAL | Status: DC | PRN

## 2021-12-13 MED ORDER — NITROGLYCERIN 0.4 MG SL SUBL
0.4000 mg | SUBLINGUAL_TABLET | SUBLINGUAL | Status: DC | PRN
  Administered 2021-12-13 (×2): 0.4 mg via SUBLINGUAL
  Filled 2021-12-13: qty 1

## 2021-12-13 MED ORDER — ASPIRIN 81 MG PO TBEC
81.0000 mg | DELAYED_RELEASE_TABLET | Freq: Every day | ORAL | Status: DC
  Administered 2021-12-14 – 2021-12-15 (×2): 81 mg via ORAL
  Filled 2021-12-13 (×2): qty 1

## 2021-12-13 MED ORDER — PRASUGREL HCL 10 MG PO TABS
10.0000 mg | ORAL_TABLET | Freq: Every day | ORAL | Status: DC
  Administered 2021-12-14 – 2021-12-15 (×2): 10 mg via ORAL
  Filled 2021-12-13 (×2): qty 1

## 2021-12-13 MED ORDER — FUROSEMIDE 10 MG/ML IJ SOLN: 80.0000 mg | Freq: Once | INTRAMUSCULAR | Status: DC

## 2021-12-13 MED ORDER — INSULIN ASPART 100 UNIT/ML IJ SOLN
0.0000 [IU] | Freq: Every day | INTRAMUSCULAR | Status: DC
  Administered 2021-12-13: 4 [IU] via SUBCUTANEOUS
  Filled 2021-12-13: qty 1

## 2021-12-13 MED ORDER — ASPIRIN 81 MG PO CHEW
324.0000 mg | CHEWABLE_TABLET | Freq: Once | ORAL | Status: AC
  Administered 2021-12-13: 324 mg via ORAL
  Filled 2021-12-13: qty 4

## 2021-12-13 MED ORDER — FUROSEMIDE 10 MG/ML IJ SOLN
60.0000 mg | Freq: Once | INTRAMUSCULAR | Status: AC
  Administered 2021-12-13: 60 mg via INTRAVENOUS
  Filled 2021-12-13: qty 8

## 2021-12-13 MED ORDER — SPIRONOLACTONE 25 MG PO TABS
25.0000 mg | ORAL_TABLET | Freq: Every day | ORAL | Status: DC
  Administered 2021-12-14 – 2021-12-15 (×2): 25 mg via ORAL
  Filled 2021-12-13 (×2): qty 1

## 2021-12-13 MED ORDER — INSULIN ASPART 100 UNIT/ML IJ SOLN
0.0000 [IU] | Freq: Three times a day (TID) | INTRAMUSCULAR | Status: DC
  Administered 2021-12-13: 5 [IU] via SUBCUTANEOUS
  Administered 2021-12-14: 9 [IU] via SUBCUTANEOUS
  Administered 2021-12-14: 5 [IU] via SUBCUTANEOUS
  Administered 2021-12-14: 9 [IU] via SUBCUTANEOUS
  Filled 2021-12-13 (×4): qty 1

## 2021-12-13 NOTE — Assessment & Plan Note (Signed)
-   I suspect this is secondary to excessive fluid intake as described in my HPI - Extensive discussion with patient at bedside that he needs to limit his p.o. intake to less than 2 L/day - I also discussed that patient will need to weigh himself every morning post urination and that we will discuss a plan in place if he gains more than 5 pounds from prior day - Patient endorses understanding and compliance - Complete echo ordered

## 2021-12-13 NOTE — ED Notes (Signed)
Repeat EKG done per MD due to reoccurring CP

## 2021-12-13 NOTE — ED Notes (Signed)
Pt given 2 nitro's and states he is now pain free, MD aware.

## 2021-12-13 NOTE — ED Notes (Signed)
Pt volunteered stated she helped pt into the restroom and he was very SOB. Pt tachypneic. This RN reevaluated pt's VS, all other VS WNL. Pt is A&OX4 and requesting water at this time.

## 2021-12-13 NOTE — ED Notes (Signed)
Transport placed by secretary  

## 2021-12-13 NOTE — Hospital Course (Addendum)
Mark Brooks is a 56 year old male with medical diagnosis of hypertension, hyperlipidemia, CAD status post PCI, insulin-dependent diabetes mellitus, GERD, BPH, who presents emergency department for chief concerns of shortness of breath for 3 to 4 days.  Of note, Hospitalization from 11/14/2021 to 11/17/2021 [facility]: Patient presented for chest pain and was admitted for NSTEMI.  Patient also had acute on chronic combined systolic and diastolic heart failure, was prescribed prasurgel. Complete echo 11/16/2021: Was read as estimated ejection fraction 40%.  Moderate global decrease in contraction.  Global hypocontractile.Left heart cath on 11/16/2021: Was read as three-vessel coronary artery disease with stable disease in LAD and left circumflex arteries compared to prior angiogram on 05/16/2019.  mRCA has 70% ISR and 90% stenosis of the R PDA with progression from prior angiogram.  Successful IVUS guided PCI to [facility] and rPDA.  Continue dual antiplatelet therapy with aspirin and ticagrelor for 1 year. 10/04: temperature of 99.2, respiration rate of 24, heart rate of 92, blood pressure 166/98, SPO2 of 99% on room air. Serum sodium is 133, potassium 4.3, chloride of 102, bicarb 24, BUN of 27, serum creatinine of 2.34, GFR 32, nonfasting blood glucose of 504, WBC 6.6, hemoglobin 12.3, platelets of 190. Per ED documentation: Patient received 500 mL bolus of fluid with EMS. ED treatment: Aspirin 324 mg p.o. one-time dose, furosemide 60 mg IV, sodium chloride 1 L bolus, and nitroglycerin sublingual 0.4 mg one-time dose.  EDP states that sodium chloride 1 L bolus was ordered however upon learning of patient's history of heart failure, the bolus was stopped and patient approximately 500 mL sodium chloride.  Admitted to hospitalist service to continue diuresis.  Echo done.  10/05: Net IO Since Admission: -2,830 mL [12/14/21 0906]. Pt reports still SOB but overall much much better from initial presentation to ED. Resulted  Echocardiogram: LVEF 35 to 40%, moderately decreased LV function, global hypokinesis, indeterminate diastolic parameters.  RV systolic function normal. 10/06: Net IO Since Admission: -5,300 mL [12/15/21 0739]. Walking around the floor and feeling restless, would like to discharge today if possible. Reviewed home meds plan and advised close cardiology follow-up, med compliance and fluid restriction compliance reviewed all questions answered      Consultants:  none  Procedures: 12/13/2021 Echocardiogram: LVEF 35 to 40%, moderately decreased LV function, global hypokinesis, indeterminate diastolic parameters.  RV systolic function normal.      ASSESSMENT & PLAN:   Principal Problem:   Acute exacerbation of CHF (congestive heart failure) (HCC) Active Problems:   Coronary artery disease   Chronic systolic CHF (congestive heart failure) (HCC)   Elevated troponin   Stage 3b chronic kidney disease (CKD) (HCC)   Type 2 diabetes mellitus with chronic kidney disease, with long-term current use of insulin (HCC)   Hyperlipidemia  Acute exacerbation of CHF (congestive heart failure) (HCC) secondary to excessive fluid intake see H&P Fluid restric less than 2 L/day, daily weights  Echo done: LVEF 35 to 40%, moderately decreased LV function, global hypokinesis, indeterminate diastolic parameters.  RV systolic function normal. Increased Entresto dose due to hypertension See discharge med rec Follow w/ cardiology outpatient   Stage 3b chronic kidney disease (CKD) (HCC) Appears to be at baseline - Cr slightly bumped up today so d/c IV diuresis  Follow BMP  Coronary artery disease With three-vessel disease prior STEMI in 2021 status post stent placement in RCA.  Patient recently presented to [facility] for chief concerns of chest pain and received left heart cath with PCI to [facility] and  right PDA. Resumed carvedilol 12.5 mg p.o. twice daily, atorvastatin 20 mg daily, Zetia 10 mg daily, eplerenone  equivalent 25 mg daily  Patient endorses compliance with aspirin and prasugrel 10 mg daily and states that he understands that these medications are required daily in order to keep his stents open  Elevated troponin Downtrending suspect this is secondary to acute heart failure exacerbation in setting of excessive fluid intake Low clinical suspicion for ACS at this time  Type 2 diabetes mellitus with chronic kidney disease, with long-term current use of insulin (HCC) Insulin SSI with at bedtime coverage ordered Goal blood glucose levels 140-180  Hyperlipidemia Atorvastatin 20 mg daily resumed

## 2021-12-13 NOTE — ED Notes (Signed)
This RN reached out to admitting MD to notify her of pt's 469 CBG, verbal orders to give 5 units at this time.

## 2021-12-13 NOTE — Assessment & Plan Note (Signed)
-   Appears to be at baseline - BMP in the a.m.

## 2021-12-13 NOTE — Assessment & Plan Note (Signed)
-   Downtrending - I suspect this is secondary to acute heart failure exacerbation in setting of excessive fluid intake - Low clinical suspicion for ACS at this time

## 2021-12-13 NOTE — Assessment & Plan Note (Signed)
-   Insulin SSI with at bedtime coverage ordered ?- Goal inpatient blood glucose levels 140-180 ?

## 2021-12-13 NOTE — Assessment & Plan Note (Addendum)
-   With three-vessel disease prior STEMI in 2021 status post stent placement in RCA.  Patient recently presented to [facility] for chief concerns of chest pain and received left heart cath with PCI to [facility] and right PDA. - Resumed carvedilol 12.5 mg p.o. twice daily, atorvastatin 20 mg daily, Zetia 10 mg daily, eplerenone equivalent 25 mg daily/spironolactone 25 mg daily - Patient endorses compliance with aspirin and prasugrel 10 mg daily and states that he understands that these medications are required daily in order to keep his stents open

## 2021-12-13 NOTE — ED Provider Notes (Signed)
[facility] Provider Note    Event Date/Time   First MD Initiated Contact with Patient 12/13/21 1144     (approximate)   History   Shortness of Breath   HPI  Mark Brooks is a 56 y.o. male with past medical history significant for CAD with PCI, CHF (EF 25%), hyperglycemia, CKD, who presents to the emergency department for shortness of breath and hyperglycemia.  Patient endorses 3-day history of progressively worsening shortness of breath and chest pressure.  States that he has not been taking his insulin as prescribed secondary to being stressed out.  Does state that he has been taking his aspirin and his heart medications except for this morning.  Endorses increased urine output.  Denies fever or chills.  Chest pressure that started 3 days ago.  Recently had a heart catheterization at [facility] and had a drug-eluting stent to RCA.  Followed by [facility] cardiology with Dr. Edwin Dada.     Physical Exam   Triage Vital Signs: ED Triage Vitals  Enc Vitals Group     BP 12/13/21 1031 (!) 166/98     Pulse Rate 12/13/21 1031 92     Resp 12/13/21 1031 (!) 24     Temp 12/13/21 1031 99.2 F (37.3 C)     Temp Source 12/13/21 1031 Oral     SpO2 12/13/21 1030 99 %     Weight 12/13/21 1031 185 lb (83.9 kg)     Height 12/13/21 1031 5\' 7"  (1.702 m)     Head Circumference --      Peak Flow --      Pain Score 12/13/21 1031 3     Pain Loc --      Pain Edu? --      Excl. in [place]? --     Most recent vital signs: Vitals:   12/13/21 1129 12/13/21 1300  BP: (!) 160/108 (!) 173/109  Pulse: 94 94  Resp: (!) 26 (!) 38  Temp:    SpO2: 99% 97%    Physical Exam  General: Awake, sitting up in bed in obvious distress CV:  Bilateral lower extremity edema with no unilateral leg swelling Resp:  Tachypneic, crackles to lower lung fields, retractions Abd:  No distention.  Other:     ED Results / Procedures / Treatments   Labs (all labs ordered are listed, but  only abnormal results are displayed) Labs Reviewed  BASIC METABOLIC PANEL - Abnormal; Notable for the following components:      Result Value   Sodium 133 (*)    Glucose, Bld 504 (*)    BUN 27 (*)    Creatinine, Ser 2.35 (*)    Calcium 8.2 (*)    GFR, Estimated 32 (*)    All other components within normal limits  CBC - Abnormal; Notable for the following components:   Hemoglobin 12.3 (*)    HCT 37.8 (*)    All other components within normal limits  BRAIN NATRIURETIC PEPTIDE - Abnormal; Notable for the following components:   B Natriuretic Peptide 3,426.3 (*)    All other components within normal limits  CBG MONITORING, ED - Abnormal; Notable for the following components:   Glucose-Capillary 441 (*)    All other components within normal limits  TROPONIN I (HIGH SENSITIVITY) - Abnormal; Notable for the following components:   Troponin I (High Sensitivity) 83 (*)    All other components within normal limits  TROPONIN I (HIGH SENSITIVITY) - Abnormal; Notable for  the following components:   Troponin I (High Sensitivity) 78 (*)    All other components within normal limits  RESP PANEL BY RT-PCR (FLU A&B, COVID) ARPGX2  PROTIME-INR  LACTIC ACID, PLASMA  MAGNESIUM     EKG  My interpretation of the EKG -normal sinus rhythm.  Normal intervals.  No chamber enlargement.  ST depression with T waves that are inverted to the lateral leads.  Flattening of T waves with mild depression to the inferior leads.  No significant change when compared to prior EKG.  No tachycardic or bradycardic dysrhythmias while on cardiac telemetry.  Sinus tachycardia.  Repeat EKG done when patient had return of his chest pain.  Repeat EKG without change from his initial EKG.  Able to view his EKGs from 1 year ago but not able to view his EKGs that were done at [facility] with a stent.   RADIOLOGY  X-ray imaging with cardiomegaly with mild pulmonary edema.  Chest x-ray was read as cardiomegaly with vascular  engorgement.  Findings concerning for mild heart failure exacerbation.   PROCEDURES:  Critical Care performed: Yes, see critical care procedure note(s)  Procedures CRITICAL CARE Performed by: Corena Herter   Total critical care time: 45 minutes  Critical care time was exclusive of separately billable procedures and treating other patients.  Critical care was necessary to treat or prevent imminent or life-threatening deterioration.  Critical care was time spent personally by me on the following activities: development of treatment plan with patient and/or surrogate as well as nursing, discussions with consultants, evaluation of patient's response to treatment, examination of patient, obtaining history from patient or surrogate, ordering and performing treatments and interventions, ordering and review of laboratory studies, ordering and review of radiographic studies, pulse oximetry and re-evaluation of patient's condition.   MEDICATIONS ORDERED IN ED: Medications  nitroGLYCERIN (NITROSTAT) SL tablet 0.4 mg (0.4 mg Sublingual Given 12/13/21 1214)  insulin aspart (novoLOG) injection 8 Units (has no administration in time range)  furosemide (LASIX) injection 60 mg (has no administration in time range)  sodium chloride 0.9 % bolus 1,000 mL (0 mLs Intravenous Stopped 12/13/21 1301)  aspirin chewable tablet 324 mg (324 mg Oral Given 12/13/21 1209)   Patient received 500 cc bolus of IV fluids.  Did not receive 1 L.  It was stopped after conversation about heart failure history.  IMPRESSION / MDM / ASSESSMENT AND PLAN / ED COURSE  I reviewed the triage vital signs and the nursing notes.   Differential diagnosis includes, but is not limited to, DKA, hyperglycemia, CHF exacerbation, ACS, pneumonia, COVID  Patient's lab work with elevated troponin.  Patient is having some chest pain so was given aspirin and nitroglycerin.  On chart review from outside records patient had a recent cardiac  catheterization with drug-eluting stent to the RCA.  This was done by [facility] cardiology on 9/7.  Does have a long history of medication noncompliance.  Chest x-ray concerning for volume overload with cardiomegaly without focal findings consistent with pneumonia.  Significantly elevated BNP.  Also has an acute kidney injury.  No significant electrolyte abnormalities.  Hyperglycemia but does not meet criteria for DKA.  Patient was initially given 500 bolus of IV fluids for his hyperglycemia however after shortness of breath and heart failure history fluids were discontinued.  Patient was given IV insulin for his hyperglycemia.  Given IV Lasix of 60 mg.  Patient was chest pain-free after 2 nitroglycerin however then started to complain of chest pain.  Repeat EKG  without changes from his initial EKG.  Was given another nitroglycerin.  Consulted cardiology and discussed patient's case, recommended holding on heparin at this time and admission to the hospitalist.  Downtrending troponin.  Consulted the hospitalist and discussed the patient's case with Dr. Sedalia Muta, admitted for acute on chronic heart failure exacerbation, AKI, hyperglycemia     Patient's presentation is most consistent with acute presentation with potential threat to life or bodily function.   FINAL CLINICAL IMPRESSION(S) / ED DIAGNOSES   Final diagnoses:  SOB (shortness of breath)  Hyperglycemia  Acute on chronic systolic congestive heart failure (HCC)  Elevated troponin     Rx / DC Orders   ED Discharge Orders     None        Note:  This document was prepared using Dragon voice recognition software and may include unintentional dictation errors.   Corena Herter, MD 12/13/21 1355

## 2021-12-13 NOTE — Progress Notes (Signed)
*  PRELIMINARY RESULTS* Echocardiogram 2D Echocardiogram has been performed.  Mark Brooks 12/13/2021, 3:35 PM

## 2021-12-13 NOTE — ED Triage Notes (Signed)
Patient arrived by EMS from home for SOB X3-4 days. Pt reports increased swelling in extremities. Takes insulin twice daily at home and reports sugars have been running high  CBG reading HI per EMS  HX diabetes, MI and CHF

## 2021-12-13 NOTE — H&P (Signed)
History and Physical   Mark Brooks ZOX:[id number] DOB: 04-23-1965 DOA: 12/13/2021  PCP: Patient, No Pcp Per  Outpatient Specialists:  Patient coming from: Home via EMS  I have personally briefly reviewed patient's old medical records in [facility] EMR.  Chief Concern: Shortness of breath  HPI: Mark Brooks is a 56 year old male with medical diagnosis of hypertension, hyperlipidemia, CAD status post PCI, insulin-dependent diabetes mellitus, GERD, BPH, who presents emergency department for chief concerns of shortness of breath for 3 to 4 days.  Initial vitals in the emergency department showed temperature of 99.2, respiration rate of 24, heart rate of 92, blood pressure 166/98, SPO2 of 99% on room air.  Serum sodium is 133, potassium 4.3, chloride of 102, bicarb 24, BUN of 27, serum creatinine of 2.34, GFR 32, nonfasting blood glucose of 504, WBC 6.6, hemoglobin 12.3, platelets of 190.  Per ED documentation: Patient received 500 mL bolus of fluid with EMS.  ED treatment: Aspirin 324 mg p.o. one-time dose, furosemide 60 mg IV, sodium chloride 1 L bolus, and nitroglycerin sublingual 0.4 mg one-time dose.  EDP states that sodium chloride 1 L bolus was ordered however upon learning of patient's history of heart failure, the bolus was stopped and patient approximately 500 mL sodium chloride. --------------------------------- At bedside patient was able to tell me his name, his age, he knows he is in the hospital.  And he knows the current calendar year.  He does not appear to be in acute distress. He endorses compliance with aspirin and prasugrel daily.  He reports that 3 days ago, starting on Sunday.   He walked on Saturday at [place] without difficulty. On Sunday, he went to church and he felt tired, weak, and increasing shortness.   He endorses new cough, that is productive of white sputum. He reports subjective fever but did not take a temperature.   He denies nausea and vomiting.  He reprots chest pain at home. He states the chest pain is dull and similar to prior episode of chest discomfort he felt in September 2023 and in 2021.   Daily approximately fluid intake: Water: 1 gal (sometimes 1+ gallon) Soda: none Coffee: none Juice: rarely Tea: unsweetened tea ( 9 bottles of 16 oz) per day Milk: none  Patient endorses approximately 10 pound weight gain since discharge from [facility].  Social history: He lives by himself. He smokes 1 -2 cigarettes per day. He denies etoh and recreational drug use.   ROS: Constitutional: + weight change, no fever ENT/Mouth: no sore throat, no rhinorrhea Eyes: no eye pain, no vision changes Cardiovascular: + chest pain, + dyspnea,  no edema, no palpitations Respiratory: no cough, no sputum, no wheezing Gastrointestinal: no nausea, no vomiting, no diarrhea, no constipation Genitourinary: no urinary incontinence, no dysuria, no hematuria Musculoskeletal: no arthralgias, no myalgias Skin: no skin lesions, no pruritus, Neuro: + weakness, no loss of consciousness, no syncope Psych: no anxiety, no depression, + decrease appetite Heme/Lymph: no bruising, no bleeding  ED Course: Discussed with emergency medicine provider, patient requiring hospitalization for chief concerns of acute heart failure exacerbation.  Assessment/Plan  Principal Problem:   Acute exacerbation of CHF (congestive heart failure) (HCC) Active Problems:   Coronary artery disease   Chronic systolic CHF (congestive heart failure) (HCC)   Elevated troponin   Stage 3b chronic kidney disease (CKD) (HCC)   Type 2 diabetes mellitus with chronic kidney disease, with long-term current use of insulin (HCC)   Hyperlipidemia   Assessment and Plan:  *  Acute exacerbation of CHF (congestive heart failure) (HCC) - I suspect this is secondary to excessive fluid intake as described in my HPI - Extensive discussion with patient at bedside that he needs to limit his p.o. intake to  less than 2 L/day - I also discussed that patient will need to weigh himself every morning post urination and that we will discuss a plan in place if he gains more than 5 pounds from prior day - Patient endorses understanding and compliance - Complete echo ordered  Hyperlipidemia - Atorvastatin 20 mg daily resumed  Type 2 diabetes mellitus with chronic kidney disease, with long-term current use of insulin (HCC) - Insulin SSI with at bedtime coverage ordered - Goal inpatient blood glucose levels 140-180  Stage 3b chronic kidney disease (CKD) ([place]) - Appears to be at baseline - BMP in the a.m.  Elevated troponin - Downtrending - I suspect this is secondary to acute heart failure exacerbation in setting of excessive fluid intake - Low clinical suspicion for ACS at this time  Coronary artery disease - With three-vessel disease prior STEMI in 2021 status post stent placement in RCA.  Patient recently presented to [facility] for chief concerns of chest pain and received left heart cath with PCI to [facility] and right PDA. - Resumed carvedilol 12.5 mg p.o. twice daily, atorvastatin 20 mg daily, Zetia 10 mg daily, eplerenone equivalent 25 mg daily/spironolactone 25 mg daily - Patient endorses compliance with aspirin and prasugrel 10 mg daily and states that he understands that these medications are required daily in order to keep his stents open  Chart reviewed.   Hospitalization from 11/14/2021 to 11/17/2021: Patient presented for chest pain and was admitted for NSTEMI.  Patient also had acute on chronic combined systolic and diastolic heart failure.  She was prescribed prasurgel.  Complete echo 11/16/2021: Was read as estimated ejection fraction 40%.  Moderate global decrease in contraction.  Global hypocontractile.  Left heart cath on 11/16/2021: Was read as three-vessel coronary artery disease with stable disease in LAD and left circumflex arteries compared to prior angiogram on 05/16/2019.  mRCA has 70% ISR  and 90% stenosis of the R PDA with progression from prior angiogram.  Successful IVUS guided PCI to [facility] and rPDA.   Continue dual antiplatelet therapy with aspirin and ticagrelor for 1 year.  Complete echo on 03/27/2020: Was read as ejection fraction estimated at 25 to 30%.  Global hypokinesis.  DVT prophylaxis: Heparin Code Status: Full code Diet: Heart healthy/carb modified Family Communication: No Disposition Plan: Pending clinical course Consults called: None at this time Admission status: Telemetry cardiac, observation  Past Medical History:  Diagnosis Date   Coronary artery disease    Diabetes mellitus without complication (HCC)    High cholesterol    Hypertension    Myocardial infarct [facility])    Past Surgical History:  Procedure Laterality Date   PERCUTANEOUS CORONARY STENT INTERVENTION (PCI-S)     Social History:  reports that he has been smoking cigarettes. He has never used smokeless tobacco. He reports that he does not currently use alcohol. He reports that he does not use drugs.  No Known Allergies Family History  Problem Relation Age of Onset   Diabetes Mellitus II Mother    Heart disease Father    Family history: Family history reviewed and not pertinent  Prior to Admission medications   Medication Sig Start Date End Date Taking? Authorizing Provider  amLODipine (NORVASC) 5 MG tablet Take 5 mg by mouth daily. 01/28/20  [provider]  atorvastatin (LIPITOR) 20 MG tablet Take 20 mg by mouth daily. 01/28/20   [provider]  clopidogrel (PLAVIX) 75 MG tablet Take 75 mg by mouth daily. 03/25/20   [provider]  dorzolamide-timolol (COSOPT) 22.3-6.8 MG/ML ophthalmic solution Place 1 drop into the right eye 2 (two) times daily. 01/15/20   [provider]  furosemide (LASIX) 40 MG tablet Take 40 mg by mouth daily. 12/22/19   [provider]  insulin NPH-regular Human (70-30) 100 UNIT/ML injection Inject 20-25 Units into  the skin 2 (two) times daily. (based upon blood sugar reading)    [provider]  isosorbide mononitrate (IMDUR) 30 MG 24 hr tablet Take 1 tablet (30 mg total) by mouth daily. 03/28/20 04/27/20  Charise Killian, MD  lisinopril (ZESTRIL) 40 MG tablet Take 40 mg by mouth daily. 01/16/20   [provider]  metFORMIN (GLUCOPHAGE-XR) 500 MG 24 hr tablet Take 500 mg by mouth 2 (two) times daily. 10/30/19   [provider]  metoprolol succinate (TOPROL-XL) 50 MG 24 hr tablet Take 50 mg by mouth daily. 03/14/20   [provider]  omeprazole (PRILOSEC OTC) 20 MG tablet Take 20 mg by mouth daily.    [provider]  spironolactone (ALDACTONE) 25 MG tablet Take 25 mg by mouth daily. 03/14/20   [provider]  tamsulosin (FLOMAX) 0.4 MG CAPS capsule Take 0.4 mg by mouth daily. 12/08/19   [provider]   Physical Exam: Vitals:   12/13/21 1600 12/13/21 1615 12/13/21 1745 12/13/21 1911  BP: (!) 163/107  (!) 158/95 (!) 168/97  Pulse: 97 (!) 103 96 100  Resp: (!) 23 (!) 32 18   Temp:  99 F (37.2 C)  99.5 F (37.5 C)  TempSrc:  Oral    SpO2: 99% 95% 99% 99%  Weight:      Height:       Constitutional: appears age-appropriate, NAD, calm, comfortable Eyes: PERRL, lids and conjunctivae normal ENMT: Mucous membranes are moist. Posterior pharynx clear of any exudate or lesions. Age-appropriate dentition. Hearing appropriate Neck: normal, supple, no masses, no thyromegaly Respiratory: clear to auscultation bilaterally, no wheezing, no crackles. Normal respiratory effort. No accessory muscle use.  Cardiovascular: Regular rate and rhythm, no murmurs / rubs / gallops. No extremity edema. 2+ pedal pulses. No carotid bruits.  Abdomen: no tenderness, no masses palpated, no hepatosplenomegaly. Bowel sounds positive.  Musculoskeletal: no clubbing / cyanosis. No joint deformity upper and lower extremities. Good ROM, no contractures, no atrophy. Normal muscle  tone.  Bilateral fingers with markedly long nails. Skin: no rashes, lesions, ulcers. No induration Neurologic: Sensation intact. Strength 5/5 in all 4.  Psychiatric: Normal judgment and insight. Alert and oriented x 3. Normal mood.   EKG: independently reviewed, showing sinus rhythm with rate of 93, QTc 458  Chest x-ray on Admission: I personally reviewed and I agree with radiologist reading as below.  DG Chest 2 View  Addendum Date: 12/13/2021   ADDENDUM REPORT: 12/13/2021 15:57 ADDENDUM: Imaging is compared to imaging from 2021, a double density along the RIGHT heart border is a new finding and may be related to worsening heart failure and LEFT atrial enlargement. Echocardiographic correlation and follow-up PA radiograph is suggested after resolution of symptoms. Electronically Signed   By: Donzetta Kohut M.D.   On: 12/13/2021 15:57   Result Date: 12/13/2021 CLINICAL DATA:  56 year old male presenting with shortness of breath and chest pain. EXAM: CHEST - 2 VIEW  COMPARISON:  Report from March of 2021. FINDINGS: Mild to moderate cardiomegaly. Fullness of hilar structures suggest pulmonary vascular engorgement. There is a double density along the RIGHT heart border a can be seen in the setting of LEFT atrial enlargement, no splaying of the carina. Perhaps mild enlargement seen on the lateral projection. Mild fissural thickening. No signs of consolidation. Subtle interstitial prominence at the lung bases and streaky opacities. No pneumothorax. On limited assessment there is no acute skeletal process. IMPRESSION: 1. Mild to moderate cardiomegaly with pulmonary vascular engorgement. 2. Subtle interstitial prominence at the lung bases and streaky opacities. Constellation of findings raising the question of mild heart failure or volume overload. A would also correlate with any signs of atypical infection 3. Double density along the RIGHT heart border a can be seen in the setting of LEFT atrial enlargement.  Seen without splaying of the carina and perhaps mild prominence of the LEFT atrium on lateral projection. Follow-up PA chest is suggested after resolution of symptoms along with echocardiography as warranted. Also comparison with prior imaging could be helpful to assess chronicity, prior imaging not available at this time and if made available an addendum can be provided. Electronically Signed: By: Donzetta Kohut M.D. On: 12/13/2021 11:15    Labs on Admission: I have personally reviewed following labs  CBC: Recent Labs  Lab 12/13/21 1036  WBC 6.6  HGB 12.3*  HCT 37.8*  MCV 87.9  PLT 190   Basic Metabolic Panel: Recent Labs  Lab 12/13/21 1036  NA 133*  K 4.3  CL 102  CO2 24  GLUCOSE 504*  BUN 27*  CREATININE 2.35*  CALCIUM 8.2*  MG 2.0   GFR: Estimated Creatinine Clearance: 36.3 mL/min (A) (by C-G formula based on SCr of 2.35 mg/dL (H)).  Coagulation Profile: Recent Labs  Lab 12/13/21 1036  INR 1.1   CBG: Recent Labs  Lab 12/13/21 1255 12/13/21 1807  GLUCAP 441* 469*   Dr. Sedalia Muta [facility]  If 7PM-7AM, please contact overnight-coverage provider If 7AM-7PM, please contact day coverage provider [email or web address]  12/13/2021, 7:46 PM

## 2021-12-13 NOTE — ED Notes (Signed)
Pt presents to ED with c/o of SOB. Pt states minimal CP at this time and rates it a 2/10. Pt does presents with CC of SOB and hyperglycemia, pt denies a new cough. Pt denies fevers or chills. Pt does also endorse issues with his blood sugar, pt states he takes 70/30 insulin at home and states he has CGM device that alarms him. Pt does admit he does not take it as RX'ed and has missed doses.   Pt states HX of CHF and MI int he past.

## 2021-12-13 NOTE — Assessment & Plan Note (Signed)
-   Atorvastatin 20 mg daily resumed 

## 2021-12-13 NOTE — ED Triage Notes (Signed)
First Nurse Note;  Pt via EMS from home. Pt c/o SOB for the past 3-4 days. Clear lung sounds. Pt also reports increased swelling in his legs. EMS reports CBG of HI, with hx of DM, pt states he has been having issues with his sugars recently. Has not taken any insulin today. Pt is A&Ox4 Pt has a hx of MI, CHF 102 ST  100% on RA 23 CO2  47 RR  176/116 20 G R AC, EMS gave 551mL of fluid

## 2021-12-14 ENCOUNTER — Observation Stay: Payer: Medicare Other

## 2021-12-14 DIAGNOSIS — E7800 Pure hypercholesterolemia, unspecified: Secondary | ICD-10-CM | POA: Diagnosis present

## 2021-12-14 DIAGNOSIS — I2510 Atherosclerotic heart disease of native coronary artery without angina pectoris: Secondary | ICD-10-CM | POA: Diagnosis present

## 2021-12-14 DIAGNOSIS — Z7984 Long term (current) use of oral hypoglycemic drugs: Secondary | ICD-10-CM | POA: Diagnosis not present

## 2021-12-14 DIAGNOSIS — Z91128 Patient's intentional underdosing of medication regimen for other reason: Secondary | ICD-10-CM | POA: Diagnosis not present

## 2021-12-14 DIAGNOSIS — Z8249 Family history of ischemic heart disease and other diseases of the circulatory system: Secondary | ICD-10-CM | POA: Diagnosis not present

## 2021-12-14 DIAGNOSIS — I130 Hypertensive heart and chronic kidney disease with heart failure and stage 1 through stage 4 chronic kidney disease, or unspecified chronic kidney disease: Secondary | ICD-10-CM | POA: Diagnosis present

## 2021-12-14 DIAGNOSIS — Z79899 Other long term (current) drug therapy: Secondary | ICD-10-CM | POA: Diagnosis not present

## 2021-12-14 DIAGNOSIS — I509 Heart failure, unspecified: Secondary | ICD-10-CM

## 2021-12-14 DIAGNOSIS — K922 Gastrointestinal hemorrhage, unspecified: Secondary | ICD-10-CM | POA: Diagnosis present

## 2021-12-14 DIAGNOSIS — I5043 Acute on chronic combined systolic (congestive) and diastolic (congestive) heart failure: Secondary | ICD-10-CM | POA: Diagnosis present

## 2021-12-14 DIAGNOSIS — N400 Enlarged prostate without lower urinary tract symptoms: Secondary | ICD-10-CM | POA: Diagnosis present

## 2021-12-14 DIAGNOSIS — Z1152 Encounter for screening for COVID-19: Secondary | ICD-10-CM | POA: Diagnosis not present

## 2021-12-14 DIAGNOSIS — I252 Old myocardial infarction: Secondary | ICD-10-CM | POA: Diagnosis not present

## 2021-12-14 DIAGNOSIS — E1151 Type 2 diabetes mellitus with diabetic peripheral angiopathy without gangrene: Secondary | ICD-10-CM | POA: Diagnosis present

## 2021-12-14 DIAGNOSIS — I5023 Acute on chronic systolic (congestive) heart failure: Secondary | ICD-10-CM | POA: Diagnosis present

## 2021-12-14 DIAGNOSIS — E1122 Type 2 diabetes mellitus with diabetic chronic kidney disease: Secondary | ICD-10-CM | POA: Diagnosis present

## 2021-12-14 DIAGNOSIS — Z955 Presence of coronary angioplasty implant and graft: Secondary | ICD-10-CM | POA: Diagnosis not present

## 2021-12-14 DIAGNOSIS — T383X6A Underdosing of insulin and oral hypoglycemic [antidiabetic] drugs, initial encounter: Secondary | ICD-10-CM | POA: Diagnosis present

## 2021-12-14 DIAGNOSIS — N1832 Chronic kidney disease, stage 3b: Secondary | ICD-10-CM | POA: Diagnosis present

## 2021-12-14 DIAGNOSIS — Z7902 Long term (current) use of antithrombotics/antiplatelets: Secondary | ICD-10-CM | POA: Diagnosis not present

## 2021-12-14 DIAGNOSIS — F17210 Nicotine dependence, cigarettes, uncomplicated: Secondary | ICD-10-CM | POA: Diagnosis present

## 2021-12-14 DIAGNOSIS — E1165 Type 2 diabetes mellitus with hyperglycemia: Secondary | ICD-10-CM | POA: Diagnosis present

## 2021-12-14 DIAGNOSIS — I5022 Chronic systolic (congestive) heart failure: Secondary | ICD-10-CM

## 2021-12-14 DIAGNOSIS — Z794 Long term (current) use of insulin: Secondary | ICD-10-CM | POA: Diagnosis not present

## 2021-12-14 DIAGNOSIS — Z833 Family history of diabetes mellitus: Secondary | ICD-10-CM | POA: Diagnosis not present

## 2021-12-14 LAB — ECHOCARDIOGRAM COMPLETE
AR max vel: 2.98 cm2
AV Area VTI: 3.05 cm2
AV Area mean vel: 2.88 cm2
AV Mean grad: 2 mmHg
AV Peak grad: 3.7 mmHg
Ao pk vel: 0.96 m/s
Area-P 1/2: 5.08 cm2
Calc EF: 35.7 %
Height: 67 in
S' Lateral: 4.6 cm
Single Plane A2C EF: 36 %
Single Plane A4C EF: 36.1 %
Weight: 2960 oz

## 2021-12-14 LAB — BASIC METABOLIC PANEL
Anion gap: 6 (ref 5–15)
BUN: 28 mg/dL — ABNORMAL HIGH (ref 6–20)
CO2: 26 mmol/L (ref 22–32)
Calcium: 8.6 mg/dL — ABNORMAL LOW (ref 8.9–10.3)
Chloride: 105 mmol/L (ref 98–111)
Creatinine, Ser: 2.25 mg/dL — ABNORMAL HIGH (ref 0.61–1.24)
GFR, Estimated: 33 mL/min — ABNORMAL LOW (ref >60)
Glucose, Bld: 222 mg/dL — ABNORMAL HIGH (ref 70–99)
Potassium: 3.5 mmol/L (ref 3.5–5.1)
Sodium: 137 mmol/L (ref 135–145)

## 2021-12-14 LAB — HEMOGLOBIN A1C
Hgb A1c MFr Bld: 11.2 % — ABNORMAL HIGH (ref 4.8–5.6)
Mean Plasma Glucose: 274.74 mg/dL

## 2021-12-14 LAB — CBC
HCT: 37.6 % — ABNORMAL LOW (ref 39.0–52.0)
Hemoglobin: 12.5 g/dL — ABNORMAL LOW (ref 13.0–17.0)
MCH: 28.3 pg (ref 26.0–34.0)
MCHC: 33.2 g/dL (ref 30.0–36.0)
MCV: 85.1 fL (ref 80.0–100.0)
Platelets: 211 10*3/uL (ref 150–400)
RBC: 4.42 MIL/uL (ref 4.22–5.81)
RDW: 12 % (ref 11.5–15.5)
WBC: 6.2 10*3/uL (ref 4.0–10.5)
nRBC: 0 % (ref 0.0–0.2)

## 2021-12-14 LAB — GLUCOSE, CAPILLARY
Glucose-Capillary: 275 mg/dL — ABNORMAL HIGH (ref 70–99)
Glucose-Capillary: 418 mg/dL — ABNORMAL HIGH (ref 70–99)
Glucose-Capillary: 377 mg/dL — ABNORMAL HIGH (ref 70–99)
Glucose-Capillary: 421 mg/dL — ABNORMAL HIGH (ref 70–99)

## 2021-12-14 LAB — HIV ANTIBODY (ROUTINE TESTING W REFLEX): HIV Screen 4th Generation wRfx: NONREACTIVE

## 2021-12-14 MED ORDER — INSULIN GLARGINE-YFGN 100 UNIT/ML ~~LOC~~ SOLN
15.0000 [IU] | Freq: Every day | SUBCUTANEOUS | Status: DC
  Administered 2021-12-14 – 2021-12-15 (×2): 15 [IU] via SUBCUTANEOUS
  Filled 2021-12-14 (×2): qty 0.15

## 2021-12-14 MED ORDER — INSULIN ASPART 100 UNIT/ML IJ SOLN
0.0000 [IU] | Freq: Three times a day (TID) | INTRAMUSCULAR | Status: DC
  Administered 2021-12-15: 20 [IU] via SUBCUTANEOUS
  Filled 2021-12-14: qty 1

## 2021-12-14 MED ORDER — SACUBITRIL-VALSARTAN 97-103 MG PO TABS
1.0000 | ORAL_TABLET | Freq: Two times a day (BID) | ORAL | Status: DC
  Administered 2021-12-14 – 2021-12-15 (×2): 1 via ORAL
  Filled 2021-12-14 (×2): qty 1

## 2021-12-14 MED ORDER — ORAL CARE MOUTH RINSE: 15.0000 mL | OROMUCOSAL | Status: DC | PRN

## 2021-12-14 MED ORDER — FUROSEMIDE 10 MG/ML IJ SOLN
40.0000 mg | Freq: Once | INTRAMUSCULAR | Status: AC
  Administered 2021-12-14: 40 mg via INTRAVENOUS
  Filled 2021-12-14: qty 4

## 2021-12-14 MED ORDER — INSULIN ASPART 100 UNIT/ML IJ SOLN
25.0000 [IU] | Freq: Once | INTRAMUSCULAR | Status: AC
  Administered 2021-12-14: 25 [IU] via SUBCUTANEOUS
  Filled 2021-12-14: qty 1

## 2021-12-14 MED ORDER — INSULIN ASPART 100 UNIT/ML IJ SOLN
3.0000 [IU] | Freq: Three times a day (TID) | INTRAMUSCULAR | Status: DC
  Administered 2021-12-14 – 2021-12-15 (×3): 3 [IU] via SUBCUTANEOUS
  Filled 2021-12-14 (×2): qty 1

## 2021-12-14 NOTE — TOC Initial Note (Signed)
Transition of Care [facility]) - Initial/Assessment Note    Patient Details  Name: Mark Brooks MRN: [id number] Date of Birth: 1965/08/03  Transition of Care [facility]) CM/SW Contact:    Alberteen Sam, LCSW Phone Number: 12/14/2021, 2:15 PM  Clinical Narrative:                  CSW spoke with patient to complete readmission risk assessment, he confirms he does have a PCP however does remember her name. States he does go to follow up appointments, and uses [facility] off [address]. Is from home alone, reports he does not need any assistance at home at this time. No problems getting medications.   Please consult TOC should needs arise.    Expected Discharge Plan: Home/Self Care Barriers to Discharge: Continued Medical Work up   Patient Goals and CMS Choice Patient states their goals for this hospitalization and ongoing recovery are:: to go home CMS Medicare.gov Compare Post Acute Care list provided to:: Patient Choice offered to / list presented to : Patient  Expected Discharge Plan and Services Expected Discharge Plan: Home/Self Care       Living arrangements for the past 2 months: Single Family Home                                      Prior Living Arrangements/Services Living arrangements for the past 2 months: Single Family Home Lives with:: Self                   Activities of Daily Living Home Assistive Devices/Equipment: CBG Meter ADL Screening (condition at time of admission) Patient's cognitive ability adequate to safely complete daily activities?: Yes Is the patient deaf or have difficulty hearing?: No Does the patient have difficulty seeing, even when wearing glasses/contacts?: No Does the patient have difficulty concentrating, remembering, or making decisions?: No Patient able to express need for assistance with ADLs?: Yes Does the patient have difficulty dressing or bathing?: No Independently performs ADLs?: Yes (appropriate for developmental  age) Does the patient have difficulty walking or climbing stairs?: No Weakness of Legs: None Weakness of Arms/Hands: None  Permission Sought/Granted                  Emotional Assessment       Orientation: : Oriented to Self, Oriented to Place, Oriented to  Time, Oriented to Situation Alcohol / Substance Use: Not Applicable Psych Involvement: No (comment)  Admission diagnosis:  SOB (shortness of breath) [R06.02] Hyperglycemia [R73.9] Elevated troponin [R79.89] Acute exacerbation of CHF (congestive heart failure) (HCC) [I50.9] Acute on chronic systolic congestive heart failure (HCC) [I50.23] Patient Active Problem List   Diagnosis Date Noted   Acute exacerbation of CHF (congestive heart failure) ([place]) 12/13/2021   Stage 3b chronic kidney disease (CKD) ([place]) 12/13/2021   Type 2 diabetes mellitus with chronic kidney disease, with long-term current use of insulin ([place]) 12/13/2021   Hyperlipidemia 12/13/2021   Chest pain 03/26/2020   Coronary artery disease    Acute renal failure superimposed on stage 3a chronic kidney disease ([place])    Chronic systolic CHF (congestive heart failure) ([place])    Hypertensive urgency    Elevated troponin    PCP:  Patient, No Pcp Per Pharmacy:   [facility] [address] [place] [address] [place] ([place]) [place] [address] Phone: [phone] Fax: [phone]  Social Determinants of Health (SDOH) Interventions    Readmission Risk Interventions     No data to display           

## 2021-12-14 NOTE — Progress Notes (Signed)
PROGRESS NOTE    Mark Brooks   KDX:[id number] DOB: 13-Jul-1965  DOA: 12/13/2021 Date of Service: 12/14/21 PCP: Patient, No Pcp Per     Brief Narrative / Hospital Course:  Mr. Mark Brooks is a 56 year old male with medical diagnosis of hypertension, hyperlipidemia, CAD status post PCI, insulin-dependent diabetes mellitus, GERD, BPH, who presents emergency department for chief concerns of shortness of breath for 3 to 4 days.  Of note, Hospitalization from 11/14/2021 to 11/17/2021 [facility]: Patient presented for chest pain and was admitted for NSTEMI.  Patient also had acute on chronic combined systolic and diastolic heart failure, was prescribed prasurgel. Complete echo 11/16/2021: Was read as estimated ejection fraction 40%.  Moderate global decrease in contraction.  Global hypocontractile.Left heart cath on 11/16/2021: Was read as three-vessel coronary artery disease with stable disease in LAD and left circumflex arteries compared to prior angiogram on 05/16/2019.  mRCA has 70% ISR and 90% stenosis of the R PDA with progression from prior angiogram.  Successful IVUS guided PCI to [facility] and rPDA.  Continue dual antiplatelet therapy with aspirin and ticagrelor for 1 year. 10/04: temperature of 99.2, respiration rate of 24, heart rate of 92, blood pressure 166/98, SPO2 of 99% on room air. Serum sodium is 133, potassium 4.3, chloride of 102, bicarb 24, BUN of 27, serum creatinine of 2.34, GFR 32, nonfasting blood glucose of 504, WBC 6.6, hemoglobin 12.3, platelets of 190. Per ED documentation: Patient received 500 mL bolus of fluid with EMS. ED treatment: Aspirin 324 mg p.o. one-time dose, furosemide 60 mg IV, sodium chloride 1 L bolus, and nitroglycerin sublingual 0.4 mg one-time dose.  EDP states that sodium chloride 1 L bolus was ordered however upon learning of patient's history of heart failure, the bolus was stopped and patient approximately 500 mL sodium chloride.  10/05: Net IO Since Admission: -2,830 mL  [12/14/21 0906]. Pt reports still SOB but overall much much better from initial presentation to ED.      Consultants:  none  Procedures: none      ASSESSMENT & PLAN:   Principal Problem:   Acute exacerbation of CHF (congestive heart failure) (HCC) Active Problems:   Coronary artery disease   Chronic systolic CHF (congestive heart failure) (HCC)   Elevated troponin   Stage 3b chronic kidney disease (CKD) (HCC)   Type 2 diabetes mellitus with chronic kidney disease, with long-term current use of insulin (HCC)   Hyperlipidemia  Acute exacerbation of CHF (congestive heart failure) (HCC) secondary to excessive fluid intake see H&P Fluid restric less than 2 L/day, daily weights  Echo pending Diuresing Strict I&O  Stage 3b chronic kidney disease (CKD) (HCC) Appears to be at baseline BMP in the a.m.  Coronary artery disease With three-vessel disease prior STEMI in 2021 status post stent placement in RCA.  Patient [facility] for chief concerns of chest pain and received left heart cath with PCI to [facility] and right PDA. Resumed carvedilol 12.5 mg p.o. twice daily, atorvastatin 20 mg daily, Zetia 10 mg daily, eplerenone equivalent 25 mg daily/spironolactone 25 mg daily Patient endorses compliance with aspirin and prasugrel 10 mg daily and states that he understands that these medications are required daily in order to keep his stents open  Elevated troponin Downtrending suspect this is secondary to acute heart failure exacerbation in setting of excessive fluid intake Low clinical suspicion for ACS at this time  Type 2 diabetes mellitus with chronic kidney disease, with long-term current use of insulin (HCC) Insulin  SSI with at bedtime coverage ordered Goal inpatient blood glucose levels 140-180  Hyperlipidemia Atorvastatin 20 mg daily resumed    DVT prophylaxis: heparin Pertinent IV fluids/nutrition: no IV fluids  Central lines / invasive devices:  none  Code Status: FULL CODE Family Communication: pt's partner on the phone while rounding   Disposition: inpatient   TOC needs: none at this time  Barriers to discharge / significant pending items: continuing diuresis and anticipate discharge home in next 1-2 days              Subjective:  Patient reports some SOB but better, no CP except w/ deep breaths. No HA/VC, no dizziness.        Objective:  Vitals:   12/13/21 2340 12/14/21 0233 12/14/21 0741 12/14/21 1157  BP: 123/84 (!) 145/108 (!) 159/97 (!) 147/97  Pulse: 99 96 94 87  Resp: 17 (!) 22 16 16   Temp: 98.6 F (37 C) 98.6 F (37 C) 98.3 F (36.8 C) 98.3 F (36.8 C)  TempSrc: Oral Oral    SpO2: 99% 100% 100% 100%  Weight:      Height:        Intake/Output Summary (Last 24 hours) at 12/14/2021 1257 Last data filed at 12/14/2021 1156 Gross per 24 hour  Intake 960 ml  Output 4900 ml  Net -3940 ml   Filed Weights   12/13/21 1031  Weight: 83.9 kg    Examination:  Constitutional:  VS as above General Appearance: alert, well-developed, well-nourished, NAD Respiratory: Normal respiratory effort No wheeze No rhonchi (+) rales Cardiovascular: S1/S2 normal No murmur No rub/gallop auscultated No JVD Trace lower extremity edema Gastrointestinal: No tenderness No masses No hernia appreciated Musculoskeletal:  No clubbing/cyanosis of digits Symmetrical movement in all extremities Neurological: No cranial nerve deficit on limited exam Alert Psychiatric: Normal judgment/insight Normal mood and affect       Scheduled Medications:   aspirin EC  81 mg Oral Daily   carvedilol  12.5 mg Oral BID PC   ezetimibe  10 mg Oral Daily   furosemide  40 mg Intravenous Once   heparin  5,000 Units Subcutaneous Q8H   insulin aspart  0-5 Units Subcutaneous QHS   insulin aspart  0-9 Units Subcutaneous TID WC   insulin aspart  3 Units Subcutaneous TID WC   insulin glargine-yfgn  15 Units Subcutaneous  Daily   pantoprazole  40 mg Oral Daily   prasugrel  10 mg Oral Daily   rosuvastatin  40 mg Oral Daily   sacubitril-valsartan  1 tablet Oral BID   spironolactone  25 mg Oral Daily    Continuous Infusions:   PRN Medications:  acetaminophen **OR** acetaminophen, nitroGLYCERIN, ondansetron **OR** ondansetron (ZOFRAN) IV, mouth rinse, senna-docusate  Antimicrobials:  Anti-infectives (From admission, onward)    None       Data Reviewed: I have personally reviewed following labs and imaging studies  CBC: Recent Labs  Lab 12/13/21 1036 12/14/21 0435  WBC 6.6 6.2  HGB 12.3* 12.5*  HCT 37.8* 37.6*  MCV 87.9 85.1  PLT 190 211   Basic Metabolic Panel: Recent Labs  Lab 12/13/21 1036 12/14/21 0435  NA 133* 137  K 4.3 3.5  CL 102 105  CO2 24 26  GLUCOSE 504* 222*  BUN 27* 28*  CREATININE 2.35* 2.25*  CALCIUM 8.2* 8.6*  MG 2.0  --    GFR: Estimated Creatinine Clearance: 38 mL/min (A) (by C-G formula based on SCr of 2.25 mg/dL (H)). Liver Function Tests:  No results for input(s): "AST", "ALT", "ALKPHOS", "BILITOT", "PROT", "ALBUMIN" in the last 168 hours. No results for input(s): "LIPASE", "AMYLASE" in the last 168 hours. No results for input(s): "AMMONIA" in the last 168 hours. Coagulation Profile: Recent Labs  Lab 12/13/21 1036  INR 1.1   Cardiac Enzymes: No results for input(s): "CKTOTAL", "CKMB", "CKMBINDEX", "TROPONINI" in the last 168 hours. BNP (last 3 results) No results for input(s): "PROBNP" in the last 8760 hours. HbA1C: Recent Labs    12/14/21 0435  HGBA1C 11.2*   CBG: Recent Labs  Lab 12/13/21 1807 12/13/21 2011 12/13/21 2331 12/14/21 0743 12/14/21 1149  GLUCAP 469* 463* 310* 275* 418*   Lipid Profile: No results for input(s): "CHOL", "HDL", "LDLCALC", "TRIG", "CHOLHDL", "LDLDIRECT" in the last 72 hours. Thyroid Function Tests: No results for input(s): "TSH", "T4TOTAL", "FREET4", "T3FREE", "THYROIDAB" in the last 72 hours. Anemia  Panel: No results for input(s): "VITAMINB12", "FOLATE", "FERRITIN", "TIBC", "IRON", "RETICCTPCT" in the last 72 hours. Urine analysis: No results found for: "COLORURINE", "APPEARANCEUR", "LABSPEC", "PHURINE", "GLUCOSEU", "HGBUR", "BILIRUBINUR", "KETONESUR", "PROTEINUR", "UROBILINOGEN", "NITRITE", "LEUKOCYTESUR" Sepsis Labs: @LABRCNTIP (procalcitonin:4,lacticidven:4)  Recent Results (from the past 240 hour(s))  Resp Panel by RT-PCR (Flu A&B, Covid) Anterior Nasal Swab     Status: None   Collection Time: 12/13/21 10:36 AM   Specimen: Anterior Nasal Swab  Result Value Ref Range Status   SARS Coronavirus 2 by RT PCR NEGATIVE NEGATIVE Final    Comment: (NOTE) SARS-CoV-2 target nucleic acids are NOT DETECTED.  The SARS-CoV-2 RNA is generally detectable in upper respiratory specimens during the acute phase of infection. The lowest concentration of SARS-CoV-2 viral copies this assay can detect is 138 copies/mL. A negative result does not preclude SARS-Cov-2 infection and should not be used as the sole basis for treatment or other patient management decisions. A negative result may occur with  improper specimen collection/handling, submission of specimen other than nasopharyngeal swab, presence of viral mutation(s) within the areas targeted by this assay, and inadequate number of viral copies(<138 copies/mL). A negative result must be combined with clinical observations, patient history, and epidemiological information. The expected result is Negative.  Fact Sheet for Patients:  02/12/22  Fact Sheet for Healthcare Providers:  BloggerCourse.com  This test is no t yet approved or cleared by the [employer] FDA and  has been authorized for detection and/or diagnosis of SARS-CoV-2 by FDA under an Emergency Use Authorization (EUA). This EUA will remain  in effect (meaning this test can be used) for the duration of the COVID-19  declaration under Section 564(b)(1) of the Act, 21 U.S.C.section 360bbb-3(b)(1), unless the authorization is terminated  or revoked sooner.       Influenza A by PCR NEGATIVE NEGATIVE Final   Influenza B by PCR NEGATIVE NEGATIVE Final    Comment: (NOTE) The Xpert Xpress SARS-CoV-2/FLU/RSV plus assay is intended as an aid in the diagnosis of influenza from Nasopharyngeal swab specimens and should not be used as a sole basis for treatment. Nasal washings and aspirates are unacceptable for Xpert Xpress SARS-CoV-2/FLU/RSV testing.  Fact Sheet for Patients: [place]  Fact Sheet for Healthcare Providers: BloggerCourse.com  This test is not yet approved or cleared by the [employer].[place] FDA and has been authorized for detection and/or diagnosis of SARS-CoV-2 by FDA under an Emergency Use Authorization (EUA). This EUA will remain in effect (meaning this test can be used) for the duration of the COVID-19 declaration under Section 564(b)(1) of the Act, 21 U.S.C. section 360bbb-3(b)(1), unless the authorization is terminated  or revoked.  Performed at [facility], [address], [place], [place] [address]          Radiology Studies: DG Chest 2 View  Result Date: 12/14/2021 CLINICAL DATA:  [id number] dyspnea on exertion. EXAM: CHEST - 2 VIEW COMPARISON:  PA Lat yesterday at 11:01 a.m. FINDINGS: PA Lat at 6:32 a.m. The heart silhouette has decreased to normal size on today's exam. No vascular congestion is seen or findings of acute edema. There is aortic tortuosity and atherosclerosis with otherwise unremarkable mediastinal configuration. The lungs are clear. The sulci are sharp. There is thoracic spondylosis. IMPRESSION: No acute radiographic chest findings. The heart silhouette was previously enlarged and now is normal in size. Electronically Signed   By: Telford Nab M.D.   On: 12/14/2021 06:51   DG Chest 2  View  Addendum Date: 12/13/2021   ADDENDUM REPORT: 12/13/2021 15:57 ADDENDUM: Imaging is compared to imaging from 2021, a double density along the RIGHT heart border is a new finding and may be related to worsening heart failure and LEFT atrial enlargement. Echocardiographic correlation and follow-up PA radiograph is suggested after resolution of symptoms. Electronically Signed   By: Zetta Bills M.D.   On: 12/13/2021 15:57   Result Date: 12/13/2021 CLINICAL DATA:  56 year old male presenting with shortness of breath and chest pain. EXAM: CHEST - 2 VIEW COMPARISON:  Report from March of 2021. FINDINGS: Mild to moderate cardiomegaly. Fullness of hilar structures suggest pulmonary vascular engorgement. There is a double density along the RIGHT heart border a can be seen in the setting of LEFT atrial enlargement, no splaying of the carina. Perhaps mild enlargement seen on the lateral projection. Mild fissural thickening. No signs of consolidation. Subtle interstitial prominence at the lung bases and streaky opacities. No pneumothorax. On limited assessment there is no acute skeletal process. IMPRESSION: 1. Mild to moderate cardiomegaly with pulmonary vascular engorgement. 2. Subtle interstitial prominence at the lung bases and streaky opacities. Constellation of findings raising the question of mild heart failure or volume overload. A would also correlate with any signs of atypical infection 3. Double density along the RIGHT heart border a can be seen in the setting of LEFT atrial enlargement. Seen without splaying of the carina and perhaps mild prominence of the LEFT atrium on lateral projection. Follow-up PA chest is suggested after resolution of symptoms along with echocardiography as warranted. Also comparison with prior imaging could be helpful to assess chronicity, prior imaging not available at this time and if made available an addendum can be provided. Electronically Signed: By: Zetta Bills M.D. On:  12/13/2021 11:15            LOS: 0 days       Emeterio Reeve, DO [facility] 12/14/2021, 12:57 PM   Staff may message me via secure chat in [place]  but this may not receive immediate response,  please page for urgent matters!  If 7PM-7AM, please contact night-coverage [email or web address]  Dictation software was used to generate the above note. Typos may occur and escape review, as with typed/written notes. Please contact Dr Sheppard Coil directly for clarity if needed.

## 2021-12-14 NOTE — Inpatient Diabetes Management (Addendum)
Inpatient Diabetes Program Recommendations  AACE/ADA: New Consensus Statement on Inpatient Glycemic Control   Target Ranges:  Prepandial:   less than 140 mg/dL      Peak postprandial:   less than 180 mg/dL (1-2 hours)      Critically ill patients:  140 - 180 mg/dL    Latest Reference Range & Units 12/13/21 12:55 12/13/21 18:07 12/13/21 20:11 12/13/21 23:31 12/14/21 07:43  Glucose-Capillary 70 - 99 mg/dL 441 (H) 469 (H) 463 (H) 310 (H) 275 (H)   Review of Glycemic Control  Diabetes history: DM2 Outpatient Diabetes medications: 70/30 20 units QAM, 70/30 30 units QPM, Metformin XR 500 mg BID (not taking) Current orders for Inpatient glycemic control: Novolog 0-9 units TID with meals, Novolog 0-5 units QHS  Inpatient Diabetes Program Recommendations:    Insulin: Please consider ordering Semglee 15 units Q24H and ordering Novolog 3 units TID with meals for meal coverage if patient eats at least 50% of meals.  Outpatient DM medication: Patient reports he is consistently taking 70/30 20 untis QAM and 30 units QPM and glucose running 300-400's at home. May want to consider adjusting outpatient 70/30 insulin regimen at discharge.   Addendum 12/14/21@12 :00-Spoke with patient at bedside about diabetes and home regimen for diabetes control. Patient reports he has a PCP but he states that he pretty much manages his own DM.  Patient states he is taking Novolin 70/30 20 units QAM and 30 units QPM for diabetes management. Inquired about Metformin and patient states he is not taking the Metformin. Patient reports he gets Novolin 70/30 over the counter at [facility].  Patient states he is taking 70/30 insulin consistently and glucose is usually 300-400's mg/dl.  Patient states he seen his PCP about 1 month ago, his glucose was high at appointment when checked, and no changes were made with insulin regimen.   Discussed importance of checking CBGs and maintaining good CBG control to prevent long-term and short-term  complications. Explained how hyperglycemia leads to damage within blood vessels which lead to the common complications seen with uncontrolled diabetes. Stressed to the patient the importance of improving glycemic control to prevent further complications from uncontrolled diabetes. Inquired about seeing an Endocrinologist and patient reports that he has seen one years ago and when asked about why he was no longer seeing an Endocrinologist he replied "They don't know what to do either. My sugar is high all the time even when I do what they say." Explained that if his glucose continues to run as high as it is, he is going to be at high risk of developing further complications from uncontrolled DM. Patient reports he would be willing to see an Endocrinologist again if a referral is made. Patient states he has all needed testing supplies and insulin at home.  Patient verbalized understanding of information discussed and reports no further questions at this time related to diabetes.  Thanks, Barnie Alderman, RN, MSN, [place] [facility] Coordinator Inpatient Diabetes Program [phone] (Team Pager from 8am to [place])

## 2021-12-14 NOTE — Consult Note (Addendum)
Heart Failure Nurse Navigator Note  HFrEF 40%.  Mild LVH.  Presented to the emergency room from home by way of EMS with complaints of increasing shortness of breath, feeling fatigued weak and a 10 pound weight gain.  BNP 3426.  Chest x-ray with mild to moderate cardiomegaly pulmonary vascular engorgement.  Comorbidities:  Hypertension Hyperlipidemia Coronary artery disease with stenting Insulin-dependent diabetes GERD Chronic kidney disease stage III Tobacco abuse  Medications:  Aspirin 81 mg daily Carvedilol 12.5 mg 2 times a day with meals Zetia 10 mg daily Effient 10 mg daily Crestor 40 mg daily Entresto 49/51 mg 2 times a day Spironolactone 25 mg daily   Labs:  Sodium 137, potassium 3.5, chloride 105, CO2 26, BUN 28, creatinine 2.25, estimated GFR 33.  Hemoglobin 12.5, hematocrit 37.6, hemoglobin A1c 11.2 Weight is 83.9 kg Blood pressure 159/97 Intake 720 mL Output 3550 mL  Initial meeting with patient who is lying quietly in bed, in no acute distress.  He states that he is divorced and he lives alone.  He does have a girlfriend.  He states that he has heard the term heart failure before and realizes that his heart function is not as strong as it it should be.  Went over his diet.  He admits to eating at [employer] [employer] but tries to get fish and chicken, along with vegetables, that has not been seasoned.  He states that he does not cook with salt nor does he added at the table.  States that he does get a large cup of ice and then fill it with soda.  From his description it sounded like a 32 ounce cup.  He also described drinking anywhere from 9 to 12-16 ounce bottles of tea, along with a gallon + of water.  He also related that he bought a 12 pack of Ensure and drank that within 3 days time.  Stressed the importance of sticking within his fluid restriction. Explained the reasoning behind.  At this time the nurse had told him that he is on a restriction of 1000 mL daily.   Broke it in down to how many cups that equal.  If it is 1000 ml he can only have 4 of the white styrofoam cups, if is 1500 that equals 6 and 2000 ml is 8.  Stressed that is all the fluid he can have  that includes, water, juice, coffee, tea, sodas, broth from soups, ice cream, jello, pudding and ice.  Explained that 1 cup of ice is equal to 1/2 cup of water.  He states that getting his medications is not a financial strain on him and that he is compliant with his medications.  He also states that transportation is not a problem.  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  He states he is interested in attending cardiac rehab as he is tired of feeling weak and fatigued --not having much stamina.  Discussed if he has all this extra fluid he probably does not have much as his heart if working harder.  He states that he would do cardiac rehab at [facility], since all his doctors are there.  Discussed follow up in the heart failure clinic- he has an appointment for October 13 at 10:30 AM.  He has a no show ratio of 25%. 1 out of 4 appointments.  He was given the living with heart failure teaching booklet, zone magnet, info  on low sodium, heart failure, fluid restriction and ways to treat thirst without drinking.  He has no further questions.  Will continue to follow.  Spoke with patients nurse, Matt as I did not see an order for strict I and O, limit.  Pricilla Riffle RN [facility]

## 2021-12-15 DIAGNOSIS — E782 Mixed hyperlipidemia: Secondary | ICD-10-CM

## 2021-12-15 DIAGNOSIS — I5023 Acute on chronic systolic (congestive) heart failure: Secondary | ICD-10-CM | POA: Diagnosis not present

## 2021-12-15 DIAGNOSIS — I2510 Atherosclerotic heart disease of native coronary artery without angina pectoris: Secondary | ICD-10-CM | POA: Diagnosis not present

## 2021-12-15 DIAGNOSIS — I5043 Acute on chronic combined systolic (congestive) and diastolic (congestive) heart failure: Secondary | ICD-10-CM | POA: Diagnosis not present

## 2021-12-15 DIAGNOSIS — I5022 Chronic systolic (congestive) heart failure: Secondary | ICD-10-CM | POA: Diagnosis not present

## 2021-12-15 DIAGNOSIS — E1122 Type 2 diabetes mellitus with diabetic chronic kidney disease: Secondary | ICD-10-CM

## 2021-12-15 DIAGNOSIS — R7989 Other specified abnormal findings of blood chemistry: Secondary | ICD-10-CM

## 2021-12-15 DIAGNOSIS — N1832 Chronic kidney disease, stage 3b: Secondary | ICD-10-CM

## 2021-12-15 DIAGNOSIS — Z794 Long term (current) use of insulin: Secondary | ICD-10-CM

## 2021-12-15 LAB — BASIC METABOLIC PANEL
Anion gap: 9 (ref 5–15)
BUN: 33 mg/dL — ABNORMAL HIGH (ref 6–20)
CO2: 28 mmol/L (ref 22–32)
Calcium: 8.8 mg/dL — ABNORMAL LOW (ref 8.9–10.3)
Chloride: 103 mmol/L (ref 98–111)
Creatinine, Ser: 2.44 mg/dL — ABNORMAL HIGH (ref 0.61–1.24)
GFR, Estimated: 30 mL/min — ABNORMAL LOW (ref >60)
Glucose, Bld: 161 mg/dL — ABNORMAL HIGH (ref 70–99)
Potassium: 3.6 mmol/L (ref 3.5–5.1)
Sodium: 140 mmol/L (ref 135–145)

## 2021-12-15 LAB — GLUCOSE, CAPILLARY: Glucose-Capillary: 362 mg/dL — ABNORMAL HIGH (ref 70–99)

## 2021-12-15 MED ORDER — FUROSEMIDE 40 MG PO TABS
40.0000 mg | ORAL_TABLET | Freq: Once | ORAL | Status: AC
  Administered 2021-12-15: 40 mg via ORAL
  Filled 2021-12-15: qty 1

## 2021-12-15 MED ORDER — TAMSULOSIN HCL 0.4 MG PO CAPS: 0.4000 mg | ORAL_CAPSULE | Freq: Every day | ORAL | 0 refills | Status: AC

## 2021-12-15 MED ORDER — FUROSEMIDE 40 MG PO TABS: 40.0000 mg | ORAL_TABLET | Freq: Every day | ORAL | 0 refills | Status: DC

## 2021-12-15 MED ORDER — FUROSEMIDE 40 MG PO TABS: 40.0000 mg | ORAL_TABLET | Freq: Two times a day (BID) | ORAL | 0 refills | Status: AC

## 2021-12-15 MED ORDER — ROSUVASTATIN CALCIUM 40 MG PO TABS: 40.0000 mg | ORAL_TABLET | Freq: Every day | ORAL | 0 refills | Status: AC

## 2021-12-15 MED ORDER — SACUBITRIL-VALSARTAN 97-103 MG PO TABS: 1.0000 | ORAL_TABLET | Freq: Two times a day (BID) | ORAL | 0 refills | Status: AC

## 2021-12-15 MED ORDER — ISOSORBIDE MONONITRATE ER 30 MG PO TB24: 30.0000 mg | ORAL_TABLET | Freq: Every day | ORAL | 0 refills | Status: AC

## 2021-12-15 MED ORDER — NITROGLYCERIN 0.4 MG SL SUBL: 0.4000 mg | SUBLINGUAL_TABLET | SUBLINGUAL | 0 refills | Status: DC | PRN

## 2021-12-15 MED ORDER — INSULIN GLARGINE-YFGN 100 UNIT/ML ~~LOC~~ SOLN
15.0000 [IU] | Freq: Once | SUBCUTANEOUS | Status: DC
  Filled 2021-12-15: qty 0.15

## 2021-12-15 NOTE — Progress Notes (Signed)
   Heart Failure Nurse Navigator Note    Met with patient, he had been out walking in the hall and just had returned to his room.  He states that he still gets fatigued and short of breath with activity but is feeling better than when admitted.  By teach back method went over fluid restrictions, dietary restrictions, reporting changes in signs and symptoms to report to physician.  Needed very little reinforcement.  Also discussed ventricle health program, he states that he would like to enroll.  Application sent to company.  Discussed follow-up in the outpatient heart failure clinic.  He had no further questions.  Pricilla Riffle RN [facility]

## 2021-12-15 NOTE — Discharge Summary (Signed)
Physician Discharge Summary   Patient: Mark Brooks MRN: [id number]  DOB: 1966-02-13   Admit:     Date of Admission: 12/13/2021 Admitted from: home   Discharge: Date of discharge: 12/15/21 Disposition: Home Condition at discharge: good  CODE STATUS: FULL CODE     Discharge Physician: Emeterio Reeve, DO [facility]     PCP: Patient, No Pcp Per  Recommendations for Outpatient Follow-up:  Follow up with PCP / Cardiology in 1-2 weeks Please obtain labs/tests: CBC, BMP in 1-2 weeks Please follow up on the following pending results: none PCP AND OTHER OUTPATIENT PROVIDERS: SEE BELOW FOR SPECIFIC DISCHARGE INSTRUCTIONS PRINTED FOR PATIENT IN ADDITION TO [place] [facility] PATIENT INFO    Discharge Diagnoses: Principal Problem:   Acute exacerbation of CHF (congestive heart failure) ([place]) Active Problems:   Coronary artery disease   Chronic systolic CHF (congestive heart failure) (H[facility])   Elevated troponin   Stage 3b chronic kidney disease (CKD) ([place])   Type 2 diabetes mellitus with chronic kidney disease, with long-term current use of insulin ([place])   Hyperlipidemia   GI bleed   CHF (congestive heart failure) [facility])       Hospital Course: Mr. Mark Brooks is a 56 year old male with medical diagnosis of hypertension, hyperlipidemia, CAD status post PCI, insulin-dependent diabetes mellitus, GERD, BPH, who presents emergency department for chief concerns of shortness of breath for 3 to 4 days.  Of note, Hospitalization from 11/14/2021 to 11/17/2021 [facility]: Patient presented for chest pain and was admitted for NSTEMI.  Patient also had acute on chronic combined systolic and diastolic heart failure, was prescribed prasurgel. Complete echo 11/16/2021: Was read as estimated ejection fraction 40%.  Moderate global decrease in contraction.  Global hypocontractile.Left heart cath on 11/16/2021: Was read as three-vessel coronary artery disease with stable disease in LAD and left circumflex  arteries compared to prior angiogram on 05/16/2019.  mRCA has 70% ISR and 90% stenosis of the R PDA with progression from prior angiogram.  Successful IVUS guided PCI to [facility] and rPDA.  Continue dual antiplatelet therapy with aspirin and ticagrelor for 1 year. 10/04: temperature of 99.2, respiration rate of 24, heart rate of 92, blood pressure 166/98, SPO2 of 99% on room air. Serum sodium is 133, potassium 4.3, chloride of 102, bicarb 24, BUN of 27, serum creatinine of 2.34, GFR 32, nonfasting blood glucose of 504, WBC 6.6, hemoglobin 12.3, platelets of 190. Per ED documentation: Patient received 500 mL bolus of fluid with EMS. ED treatment: Aspirin 324 mg p.o. one-time dose, furosemide 60 mg IV, sodium chloride 1 L bolus, and nitroglycerin sublingual 0.4 mg one-time dose.  EDP states that sodium chloride 1 L bolus was ordered however upon learning of patient's history of heart failure, the bolus was stopped and patient approximately 500 mL sodium chloride.  Admitted to hospitalist service to continue diuresis.  Echo done.  10/05: Net IO Since Admission: -2,830 mL [12/14/21 0906]. Pt reports still SOB but overall much much better from initial presentation to ED. Resulted Echocardiogram: LVEF 35 to 40%, moderately decreased LV function, global hypokinesis, indeterminate diastolic parameters.  RV systolic function normal. 10/06: Net IO Since Admission: -5,300 mL [12/15/21 0739]. Walking around the floor and feeling restless, would like to discharge today if possible. Reviewed home meds plan and advised close cardiology follow-up, med compliance and fluid restriction compliance reviewed all questions answered      Consultants:  none  Procedures: 12/13/2021 Echocardiogram: LVEF 35 to 40%, moderately decreased LV function,  global hypokinesis, indeterminate diastolic parameters.  RV systolic function normal.      ASSESSMENT & PLAN:   Principal Problem:   Acute exacerbation of CHF (congestive heart  failure) (HCC) Active Problems:   Coronary artery disease   Chronic systolic CHF (congestive heart failure) (HCC)   Elevated troponin   Stage 3b chronic kidney disease (CKD) (HCC)   Type 2 diabetes mellitus with chronic kidney disease, with long-term current use of insulin (HCC)   Hyperlipidemia  Acute exacerbation of CHF (congestive heart failure) (HCC) secondary to excessive fluid intake see H&P Fluid restric less than 2 L/day, daily weights  Echo done: LVEF 35 to 40%, moderately decreased LV function, global hypokinesis, indeterminate diastolic parameters.  RV systolic function normal. Increased Entresto dose due to hypertension See discharge med rec Follow w/ cardiology outpatient   Stage 3b chronic kidney disease (CKD) ([place]) Appears to be at baseline - Cr slightly bumped up today so d/c IV diuresis  Follow BMP  Coronary artery disease With three-vessel disease prior STEMI in 2021 status post stent placement in RCA.  Patient recently presented to [facility] for chief concerns of chest pain and received left heart cath with PCI to [facility] and right PDA. Resumed carvedilol 12.5 mg p.o. twice daily, atorvastatin 20 mg daily, Zetia 10 mg daily, eplerenone equivalent 25 mg daily  Patient endorses compliance with aspirin and prasugrel 10 mg daily and states that he understands that these medications are required daily in order to keep his stents open  Elevated troponin Downtrending suspect this is secondary to acute heart failure exacerbation in setting of excessive fluid intake Low clinical suspicion for ACS at this time  Type 2 diabetes mellitus with chronic kidney disease, with long-term current use of insulin (HCC) Insulin SSI with at bedtime coverage ordered Goal blood glucose levels 140-180  Hyperlipidemia Atorvastatin 20 mg daily resumed             Discharge Instructions  Allergies as of 12/15/2021   No Known Allergies      Medication List     STOP taking these  medications    amLODipine 5 MG tablet Commonly known as: NORVASC   atorvastatin 20 MG tablet Commonly known as: LIPITOR   clopidogrel 75 MG tablet Commonly known as: PLAVIX   Entresto 49-51 MG Generic drug: sacubitril-valsartan Replaced by: sacubitril-valsartan 97-103 MG   lisinopril 40 MG tablet Commonly known as: ZESTRIL   metFORMIN 500 MG 24 hr tablet Commonly known as: GLUCOPHAGE-XR   metoprolol succinate 50 MG 24 hr tablet Commonly known as: TOPROL-XL   spironolactone 25 MG tablet Commonly known as: ALDACTONE       TAKE these medications    acetaminophen 500 MG tablet Commonly known as: TYLENOL Take 500 mg by mouth every 6 (six) hours as needed.   aspirin EC 81 MG tablet Take 81 mg by mouth daily. Swallow whole.   carvedilol 12.5 MG tablet Commonly known as: COREG Take 12.5 mg by mouth 2 (two) times daily.   dorzolamide-timolol 22.3-6.8 MG/ML ophthalmic solution Commonly known as: COSOPT Place 1 drop into the right eye 2 (two) times daily.   eplerenone 25 MG tablet Commonly known as: INSPRA Take 25 mg by mouth daily.   ezetimibe 10 MG tablet Commonly known as: ZETIA Take 10 mg by mouth daily.   furosemide 40 MG tablet Commonly known as: LASIX Take 1 tablet (40 mg total) by mouth 2 (two) times daily. Increase to 1 tablet (40 mg total) by mouth THREE  TIMES daily (total daily dose 120 mg) as needed for up to 3 days for increased leg swelling, shortness of breath, weight gain 5+ lbs over 1-2 days. Seek medical care if these symptoms are not improving with increased dose. What changed:  when to take this additional instructions   insulin NPH-regular Human (70-30) 100 UNIT/ML injection Inject 20-25 Units into the skin 2 (two) times daily. (based upon blood sugar reading)   isosorbide mononitrate 30 MG 24 hr tablet Commonly known as: IMDUR Take 1 tablet (30 mg total) by mouth daily.   nitroGLYCERIN 0.4 MG SL tablet Commonly known as:  NITROSTAT Place 1 tablet (0.4 mg total) under the tongue every 5 (five) minutes as needed for chest pain.   omeprazole 20 MG tablet Commonly known as: PRILOSEC OTC Take 20 mg by mouth daily.   prasugrel 10 MG Tabs tablet Commonly known as: EFFIENT Take 10 mg by mouth daily.   rosuvastatin 40 MG tablet Commonly known as: CRESTOR Take 1 tablet (40 mg total) by mouth daily.   sacubitril-valsartan 97-103 MG Commonly known as: ENTRESTO Take 1 tablet by mouth 2 (two) times daily. Replaces: Entresto 49-51 MG   tamsulosin 0.4 MG Caps capsule Commonly known as: FLOMAX Take 1 capsule (0.4 mg total) by mouth daily.         Follow-up Information     Self Follow up.   Contact information: Pt. to call for follow up appointments himself                No Known Allergies   Subjective: pt feeling well, no CP/SOB, today is his birthday!    Discharge Exam: BP 132/81 (BP Location: Right Arm)   Pulse 88   Temp 98.3 F (36.8 C)   Resp 18   Ht 5\' 7"  (1.702 m)   Wt 81.9 kg   SpO2 100%   BMI 28.27 kg/m  General: Pt is alert, awake, not in acute distress Cardiovascular: RRR, S1/S2 +, no rubs, no gallops - rales have resolved  Respiratory: CTA bilaterally, no wheezing, no rhonchi Abdominal: Soft, NT, ND, bowel sounds + Extremities: no edema, no cyanosis     The results of significant diagnostics from this hospitalization (including imaging, microbiology, ancillary and laboratory) are listed below for reference.     Microbiology: Recent Results (from the past 240 hour(s))  Resp Panel by RT-PCR (Flu A&B, Covid) Anterior Nasal Swab     Status: None   Collection Time: 12/13/21 10:36 AM   Specimen: Anterior Nasal Swab  Result Value Ref Range Status   SARS Coronavirus 2 by RT PCR NEGATIVE NEGATIVE Final    Comment: (NOTE) SARS-CoV-2 target nucleic acids are NOT DETECTED.  The SARS-CoV-2 RNA is generally detectable in upper respiratory specimens during the acute phase  of infection. The lowest concentration of SARS-CoV-2 viral copies this assay can detect is 138 copies/mL. A negative result does not preclude SARS-Cov-2 infection and should not be used as the sole basis for treatment or other patient management decisions. A negative result may occur with  improper specimen collection/handling, submission of specimen other than nasopharyngeal swab, presence of viral mutation(s) within the areas targeted by this assay, and inadequate number of viral copies(<138 copies/mL). A negative result must be combined with clinical observations, patient history, and epidemiological information. The expected result is Negative.  Fact Sheet for Patients:  EntrepreneurPulse.com.au  Fact Sheet for Healthcare Providers:  IncredibleEmployment.be  This test is no t yet approved or cleared by the [place]  States FDA and  has been authorized for detection and/or diagnosis of SARS-CoV-2 by FDA under an Emergency Use Authorization (EUA). This EUA will remain  in effect (meaning this test can be used) for the duration of the COVID-19 declaration under Section 564(b)(1) of the Act, 21 U.S.C.section 360bbb-3(b)(1), unless the authorization is terminated  or revoked sooner.       Influenza A by PCR NEGATIVE NEGATIVE Final   Influenza B by PCR NEGATIVE NEGATIVE Final    Comment: (NOTE) The Xpert Xpress SARS-CoV-2/FLU/RSV plus assay is intended as an aid in the diagnosis of influenza from Nasopharyngeal swab specimens and should not be used as a sole basis for treatment. Nasal washings and aspirates are unacceptable for Xpert Xpress SARS-CoV-2/FLU/RSV testing.  Fact Sheet for Patients: EntrepreneurPulse.com.au  Fact Sheet for Healthcare Providers: IncredibleEmployment.be  This test is not yet approved or cleared by the [place] FDA and has been authorized for detection and/or diagnosis of  SARS-CoV-2 by FDA under an Emergency Use Authorization (EUA). This EUA will remain in effect (meaning this test can be used) for the duration of the COVID-19 declaration under Section 564(b)(1) of the Act, 21 U.S.C. section 360bbb-3(b)(1), unless the authorization is terminated or revoked.  Performed at [facility], [address] [place], [place] [address]      Labs: BNP (last 3 results) Recent Labs    12/13/21 1036  BNP [id number]   Basic Metabolic Panel: Recent Labs  Lab 12/13/21 1036 12/14/21 0435 12/15/21 0448  NA 133* 137 140  K 4.3 3.5 3.6  CL 102 105 103  CO2 24 26 28   GLUCOSE 504* 222* 161*  BUN 27* 28* 33*  CREATININE 2.35* 2.25* 2.44*  CALCIUM 8.2* 8.6* 8.8*  MG 2.0  --   --    Liver Function Tests: No results for input(s): "AST", "ALT", "ALKPHOS", "BILITOT", "PROT", "ALBUMIN" in the last 168 hours. No results for input(s): "LIPASE", "AMYLASE" in the last 168 hours. No results for input(s): "AMMONIA" in the last 168 hours. CBC: Recent Labs  Lab 12/13/21 1036 12/14/21 0435  WBC 6.6 6.2  HGB 12.3* 12.5*  HCT 37.8* 37.6*  MCV 87.9 85.1  PLT 190 211   Cardiac Enzymes: No results for input(s): "CKTOTAL", "CKMB", "CKMBINDEX", "TROPONINI" in the last 168 hours. BNP: Invalid input(s): "POCBNP" CBG: Recent Labs  Lab 12/14/21 0743 12/14/21 1149 12/14/21 1651 12/14/21 2202 12/15/21 0735  GLUCAP 275* 418* 377* 421* 362*   D-Dimer No results for input(s): "DDIMER" in the last 72 hours. Hgb A1c Recent Labs    12/14/21 0435  HGBA1C 11.2*   Lipid Profile No results for input(s): "CHOL", "HDL", "LDLCALC", "TRIG", "CHOLHDL", "LDLDIRECT" in the last 72 hours. Thyroid function studies No results for input(s): "TSH", "T4TOTAL", "T3FREE", "THYROIDAB" in the last 72 hours.  Invalid input(s): "FREET3" Anemia work up No results for input(s): "VITAMINB12", "FOLATE", "FERRITIN", "TIBC", "IRON", "RETICCTPCT" in the last 72 hours. Urinalysis No  results found for: "COLORURINE", "APPEARANCEUR", "LABSPEC", "PHURINE", "GLUCOSEU", "HGBUR", "BILIRUBINUR", "KETONESUR", "PROTEINUR", "UROBILINOGEN", "NITRITE", "LEUKOCYTESUR" Sepsis Labs Recent Labs  Lab 12/13/21 1036 12/14/21 0435  WBC 6.6 6.2   Microbiology Recent Results (from the past 240 hour(s))  Resp Panel by RT-PCR (Flu A&B, Covid) Anterior Nasal Swab     Status: None   Collection Time: 12/13/21 10:36 AM   Specimen: Anterior Nasal Swab  Result Value Ref Range Status   SARS Coronavirus 2 by RT PCR NEGATIVE NEGATIVE Final    Comment: (NOTE) SARS-CoV-2 target nucleic acids  are NOT DETECTED.  The SARS-CoV-2 RNA is generally detectable in upper respiratory specimens during the acute phase of infection. The lowest concentration of SARS-CoV-2 viral copies this assay can detect is 138 copies/mL. A negative result does not preclude SARS-Cov-2 infection and should not be used as the sole basis for treatment or other patient management decisions. A negative result may occur with  improper specimen collection/handling, submission of specimen other than nasopharyngeal swab, presence of viral mutation(s) within the areas targeted by this assay, and inadequate number of viral copies(<138 copies/mL). A negative result must be combined with clinical observations, patient history, and epidemiological information. The expected result is Negative.  Fact Sheet for Patients:  EntrepreneurPulse.com.au  Fact Sheet for Healthcare Providers:  IncredibleEmployment.be  This test is no t yet approved or cleared by the [place] FDA and  has been authorized for detection and/or diagnosis of SARS-CoV-2 by FDA under an Emergency Use Authorization (EUA). This EUA will remain  in effect (meaning this test can be used) for the duration of the COVID-19 declaration under Section 564(b)(1) of the Act, 21 U.S.C.section 360bbb-3(b)(1), unless the authorization is  terminated  or revoked sooner.       Influenza A by PCR NEGATIVE NEGATIVE Final   Influenza B by PCR NEGATIVE NEGATIVE Final    Comment: (NOTE) The Xpert Xpress SARS-CoV-2/FLU/RSV plus assay is intended as an aid in the diagnosis of influenza from Nasopharyngeal swab specimens and should not be used as a sole basis for treatment. Nasal washings and aspirates are unacceptable for Xpert Xpress SARS-CoV-2/FLU/RSV testing.  Fact Sheet for Patients: EntrepreneurPulse.com.au  Fact Sheet for Healthcare Providers: IncredibleEmployment.be  This test is not yet approved or cleared by the [place] FDA and has been authorized for detection and/or diagnosis of SARS-CoV-2 by FDA under an Emergency Use Authorization (EUA). This EUA will remain in effect (meaning this test can be used) for the duration of the COVID-19 declaration under Section 564(b)(1) of the Act, 21 U.S.C. section 360bbb-3(b)(1), unless the authorization is terminated or revoked.  Performed at [facility], [address] [place], [place] [address]    Imaging ECHOCARDIOGRAM COMPLETE  Result Date: 12/14/2021    ECHOCARDIOGRAM REPORT   Patient Name:   Mark Brooks Date of Exam: 12/13/2021 Medical Rec #:  [id number]     Height:       67.0 in Accession #:    [id number]    Weight:       185.0 lb Date of Birth:  08-Feb-1966     BSA:          1.956 m Patient Age:    37 years      BP:           153/96 mmHg Patient Gender: M             HR:           92 bpm. Exam Location:  [facility] Procedure: 2D Echo, Color Doppler, Cardiac Doppler and Intracardiac            Opacification Agent Indications:     R06.00 Dyspnea; Elevated Troponin  History:         Patient has prior history of Echocardiogram examinations, most                  recent 03/27/2020. HFrEF, CAD and Previous Myocardial                  Infarction, CKD stage 3b; Risk Factors:Hypertension,  HCL and                  Diabetes.   Sonographer:     Charmayne Sheer Referring Phys:  [id number] AMY N COX Diagnosing Phys: Isaias Cowman MD IMPRESSIONS  1. Left ventricular ejection fraction, by estimation, is 35 to 40%. The left ventricle has moderately decreased function. The left ventricle demonstrates global hypokinesis. Left ventricular diastolic parameters are indeterminate.  2. Right ventricular systolic function is normal. The right ventricular size is normal.  3. The mitral valve is normal in structure. Mild mitral valve regurgitation. No evidence of mitral stenosis.  4. The aortic valve is normal in structure. Aortic valve regurgitation is not visualized. No aortic stenosis is present.  5. The inferior vena cava is normal in size with greater than 50% respiratory variability, suggesting right atrial pressure of 3 mmHg. FINDINGS  Left Ventricle: Left ventricular ejection fraction, by estimation, is 35 to 40%. The left ventricle has moderately decreased function. The left ventricle demonstrates global hypokinesis. Definity contrast agent was given IV to delineate the left ventricular endocardial borders. The left ventricular internal cavity size was normal in size. There is no left ventricular hypertrophy. Left ventricular diastolic parameters are indeterminate. Right Ventricle: The right ventricular size is normal. No increase in right ventricular wall thickness. Right ventricular systolic function is normal. Left Atrium: Left atrial size was normal in size. Right Atrium: Right atrial size was normal in size. Pericardium: There is no evidence of pericardial effusion. Mitral Valve: The mitral valve is normal in structure. Mild mitral valve regurgitation. No evidence of mitral valve stenosis. Tricuspid Valve: The tricuspid valve is normal in structure. Tricuspid valve regurgitation is mild . No evidence of tricuspid stenosis. Aortic Valve: The aortic valve is normal in structure. Aortic valve regurgitation is not visualized. No aortic stenosis  is present. Aortic valve mean gradient measures 2.0 mmHg. Aortic valve peak gradient measures 3.7 mmHg. Aortic valve area, by VTI measures 3.05 cm. Pulmonic Valve: The pulmonic valve was normal in structure. Pulmonic valve regurgitation is not visualized. No evidence of pulmonic stenosis. Aorta: The aortic root is normal in size and structure. Venous: The inferior vena cava is normal in size with greater than 50% respiratory variability, suggesting right atrial pressure of 3 mmHg. IAS/Shunts: No atrial level shunt detected by color flow Doppler.  LEFT VENTRICLE PLAX 2D LVIDd:         5.50 cm      Diastology LVIDs:         4.60 cm      LV e' medial:    7.72 cm/s LV PW:         1.20 cm      LV E/e' medial:  12.7 LV IVS:        1.10 cm      LV e' lateral:   6.64 cm/s LVOT diam:     2.10 cm      LV E/e' lateral: 14.8 LV SV:         52 LV SV Index:   26 LVOT Area:     3.46 cm  LV Volumes (MOD) LV vol d, MOD A2C: 178.0 ml LV vol d, MOD A4C: 180.0 ml LV vol s, MOD A2C: 114.0 ml LV vol s, MOD A4C: 115.0 ml LV SV MOD A2C:     64.0 ml LV SV MOD A4C:     180.0 ml LV SV MOD BP:      64.8 ml RIGHT VENTRICLE RV  Basal diam:  3.50 cm TAPSE (M-mode): 2.0 cm LEFT ATRIUM             Index        RIGHT ATRIUM           Index LA diam:        3.90 cm 1.99 cm/m   RA Area:     14.40 cm LA Vol (A2C):   51.4 ml 26.27 ml/m  RA Volume:   34.30 ml  17.53 ml/m LA Vol (A4C):   46.6 ml 23.82 ml/m LA Biplane Vol: 53.1 ml 27.14 ml/m  AORTIC VALVE                    PULMONIC VALVE AV Area (Vmax):    2.98 cm     PV Vmax:       0.61 m/s AV Area (Vmean):   2.88 cm     PV Peak grad:  1.5 mmHg AV Area (VTI):     3.05 cm AV Vmax:           95.80 cm/s AV Vmean:          66.700 cm/s AV VTI:            0.169 m AV Peak Grad:      3.7 mmHg AV Mean Grad:      2.0 mmHg LVOT Vmax:         82.50 cm/s LVOT Vmean:        55.400 cm/s LVOT VTI:          0.149 m LVOT/AV VTI ratio: 0.88  AORTA Ao Root diam: 3.70 cm MITRAL VALVE MV Area (PHT): 5.08 cm     SHUNTS MV Decel Time: 149 msec    Systemic VTI:  0.15 m MV E velocity: 98.40 cm/s  Systemic Diam: 2.10 cm Isaias Cowman MD Electronically signed by Isaias Cowman MD Signature Date/Time: 12/14/2021/1:38:18 PM    Final    DG Chest 2 View  Result Date: 12/14/2021 CLINICAL DATA:  [id number] dyspnea on exertion. EXAM: CHEST - 2 VIEW COMPARISON:  PA Lat yesterday at 11:01 a.m. FINDINGS: PA Lat at 6:32 a.m. The heart silhouette has decreased to normal size on today's exam. No vascular congestion is seen or findings of acute edema. There is aortic tortuosity and atherosclerosis with otherwise unremarkable mediastinal configuration. The lungs are clear. The sulci are sharp. There is thoracic spondylosis. IMPRESSION: No acute radiographic chest findings. The heart silhouette was previously enlarged and now is normal in size. Electronically Signed   By: Telford Nab M.D.   On: 12/14/2021 06:51   DG Chest 2 View  Addendum Date: 12/13/2021   ADDENDUM REPORT: 12/13/2021 15:57 ADDENDUM: Imaging is compared to imaging from 2021, a double density along the RIGHT heart border is a new finding and may be related to worsening heart failure and LEFT atrial enlargement. Echocardiographic correlation and follow-up PA radiograph is suggested after resolution of symptoms. Electronically Signed   By: Zetta Bills M.D.   On: 12/13/2021 15:57   Result Date: 12/13/2021 CLINICAL DATA:  56 year old male presenting with shortness of breath and chest pain. EXAM: CHEST - 2 VIEW COMPARISON:  Report from March of 2021. FINDINGS: Mild to moderate cardiomegaly. Fullness of hilar structures suggest pulmonary vascular engorgement. There is a double density along the RIGHT heart border a can be seen in the setting of LEFT atrial enlargement, no splaying of the carina. Perhaps mild enlargement seen on the lateral projection. Mild  fissural thickening. No signs of consolidation. Subtle interstitial prominence at the lung bases and  streaky opacities. No pneumothorax. On limited assessment there is no acute skeletal process. IMPRESSION: 1. Mild to moderate cardiomegaly with pulmonary vascular engorgement. 2. Subtle interstitial prominence at the lung bases and streaky opacities. Constellation of findings raising the question of mild heart failure or volume overload. A would also correlate with any signs of atypical infection 3. Double density along the RIGHT heart border a can be seen in the setting of LEFT atrial enlargement. Seen without splaying of the carina and perhaps mild prominence of the LEFT atrium on lateral projection. Follow-up PA chest is suggested after resolution of symptoms along with echocardiography as warranted. Also comparison with prior imaging could be helpful to assess chronicity, prior imaging not available at this time and if made available an addendum can be provided. Electronically Signed: By: Zetta Bills M.D. On: 12/13/2021 11:15      Time coordinating discharge: over 30 minutes  SIGNED:  Emeterio Reeve DO [facility]

## 2021-12-15 NOTE — Inpatient Diabetes Management (Signed)
Inpatient Diabetes Program Recommendations  AACE/ADA: New Consensus Statement on Inpatient Glycemic Control  Target Ranges:  Prepandial:   less than 140 mg/dL      Peak postprandial:   less than 180 mg/dL (1-2 hours)      Critically ill patients:  140 - 180 mg/dL    Latest Reference Range & Units 12/15/21 07:35  Glucose-Capillary 70 - 99 mg/dL 362 (H)    Latest Reference Range & Units 12/14/21 07:43 12/14/21 11:49 12/14/21 16:51 12/14/21 22:02  Glucose-Capillary 70 - 99 mg/dL 275 (H) 418 (H) 377 (H) 421 (H)   Review of Glycemic Control  Diabetes history: DM2 Outpatient Diabetes medications: 70/30 20 units QAM, 70/30 30 units QPM, Metformin XR 500 mg BID (not taking) Current orders for Inpatient glycemic control: Semglee 15 units daily, Novolog 0-20 units TID with meals, Novolog 0-5 units QHS, Novolog 3 units TID with meals   Inpatient Diabetes Program Recommendations:     Insulin: Please consider increasing Semglee 30 units daily (order additional one time Semglee 15 units x1 now since patient already given 15 units this am) and increasing meal coverage to Novolog 8 units TID with meals if patient eats at least 50% of meals.   Outpatient DM medication: Patient reports he is consistently taking 70/30 20 untis QAM and 30 units QPM and glucose running 300-400's at home. May want to consider adjusting outpatient 70/30 insulin regimen at discharge.   Thanks, Barnie Alderman, RN, MSN, [place] [facility] Coordinator Inpatient Diabetes Program [phone] (Team Pager from 8am to [place])

## 2021-12-22 ENCOUNTER — Other Ambulatory Visit: Payer: Self-pay

## 2021-12-22 ENCOUNTER — Emergency Department: Payer: Medicare Other

## 2021-12-22 ENCOUNTER — Inpatient Hospital Stay
Admission: EM | Admit: 2021-12-22 | Discharge: 2021-12-25 | DRG: 303 | Disposition: A | Discharge status: Home / Self Care | Payer: Medicare Other | Attending: Osteopathic Medicine | Admitting: Osteopathic Medicine

## 2021-12-22 ENCOUNTER — Telehealth: Payer: Self-pay | Admitting: Family

## 2021-12-22 ENCOUNTER — Ambulatory Visit: Payer: Medicare Other | Admitting: Family

## 2021-12-22 DIAGNOSIS — Z955 Presence of coronary angioplasty implant and graft: Secondary | ICD-10-CM | POA: Diagnosis not present

## 2021-12-22 DIAGNOSIS — Z7982 Long term (current) use of aspirin: Secondary | ICD-10-CM

## 2021-12-22 DIAGNOSIS — Z8249 Family history of ischemic heart disease and other diseases of the circulatory system: Secondary | ICD-10-CM

## 2021-12-22 DIAGNOSIS — I200 Unstable angina: Secondary | ICD-10-CM | POA: Diagnosis present

## 2021-12-22 DIAGNOSIS — I5022 Chronic systolic (congestive) heart failure: Secondary | ICD-10-CM | POA: Diagnosis present

## 2021-12-22 DIAGNOSIS — E1065 Type 1 diabetes mellitus with hyperglycemia: Secondary | ICD-10-CM | POA: Diagnosis not present

## 2021-12-22 DIAGNOSIS — I252 Old myocardial infarction: Secondary | ICD-10-CM

## 2021-12-22 DIAGNOSIS — R7989 Other specified abnormal findings of blood chemistry: Secondary | ICD-10-CM | POA: Diagnosis present

## 2021-12-22 DIAGNOSIS — I2510 Atherosclerotic heart disease of native coronary artery without angina pectoris: Secondary | ICD-10-CM

## 2021-12-22 DIAGNOSIS — I25110 Atherosclerotic heart disease of native coronary artery with unstable angina pectoris: Secondary | ICD-10-CM | POA: Diagnosis present

## 2021-12-22 DIAGNOSIS — I130 Hypertensive heart and chronic kidney disease with heart failure and stage 1 through stage 4 chronic kidney disease, or unspecified chronic kidney disease: Secondary | ICD-10-CM | POA: Diagnosis present

## 2021-12-22 DIAGNOSIS — I255 Ischemic cardiomyopathy: Secondary | ICD-10-CM | POA: Diagnosis present

## 2021-12-22 DIAGNOSIS — R5383 Other fatigue: Secondary | ICD-10-CM | POA: Diagnosis present

## 2021-12-22 DIAGNOSIS — Z79899 Other long term (current) drug therapy: Secondary | ICD-10-CM

## 2021-12-22 DIAGNOSIS — Z1152 Encounter for screening for COVID-19: Secondary | ICD-10-CM

## 2021-12-22 DIAGNOSIS — N179 Acute kidney failure, unspecified: Secondary | ICD-10-CM | POA: Diagnosis not present

## 2021-12-22 DIAGNOSIS — F17210 Nicotine dependence, cigarettes, uncomplicated: Secondary | ICD-10-CM | POA: Diagnosis present

## 2021-12-22 DIAGNOSIS — N1832 Chronic kidney disease, stage 3b: Secondary | ICD-10-CM | POA: Diagnosis present

## 2021-12-22 DIAGNOSIS — K219 Gastro-esophageal reflux disease without esophagitis: Secondary | ICD-10-CM | POA: Diagnosis present

## 2021-12-22 DIAGNOSIS — E7800 Pure hypercholesterolemia, unspecified: Secondary | ICD-10-CM | POA: Diagnosis present

## 2021-12-22 DIAGNOSIS — E1022 Type 1 diabetes mellitus with diabetic chronic kidney disease: Secondary | ICD-10-CM | POA: Diagnosis present

## 2021-12-22 DIAGNOSIS — Z794 Long term (current) use of insulin: Secondary | ICD-10-CM

## 2021-12-22 DIAGNOSIS — Z833 Family history of diabetes mellitus: Secondary | ICD-10-CM

## 2021-12-22 DIAGNOSIS — R079 Chest pain, unspecified: Secondary | ICD-10-CM

## 2021-12-22 DIAGNOSIS — E1122 Type 2 diabetes mellitus with diabetic chronic kidney disease: Secondary | ICD-10-CM

## 2021-12-22 DIAGNOSIS — E109 Type 1 diabetes mellitus without complications: Secondary | ICD-10-CM

## 2021-12-22 LAB — TROPONIN I (HIGH SENSITIVITY)
Troponin I (High Sensitivity): 40 ng/L — ABNORMAL HIGH (ref <18)
Troponin I (High Sensitivity): 34 ng/L — ABNORMAL HIGH (ref <18)

## 2021-12-22 LAB — COMPREHENSIVE METABOLIC PANEL
ALT: 13 U/L (ref 0–44)
AST: 22 U/L (ref 15–41)
Albumin: 3 g/dL — ABNORMAL LOW (ref 3.5–5.0)
Alkaline Phosphatase: 50 U/L (ref 38–126)
Anion gap: 8 (ref 5–15)
BUN: 27 mg/dL — ABNORMAL HIGH (ref 6–20)
CO2: 27 mmol/L (ref 22–32)
Calcium: 8.5 mg/dL — ABNORMAL LOW (ref 8.9–10.3)
Chloride: 105 mmol/L (ref 98–111)
Creatinine, Ser: 2.34 mg/dL — ABNORMAL HIGH (ref 0.61–1.24)
GFR, Estimated: 32 mL/min — ABNORMAL LOW (ref >60)
Glucose, Bld: 83 mg/dL (ref 70–99)
Potassium: 3.9 mmol/L (ref 3.5–5.1)
Sodium: 140 mmol/L (ref 135–145)
Total Bilirubin: 0.7 mg/dL (ref 0.3–1.2)
Total Protein: 6.7 g/dL (ref 6.5–8.1)

## 2021-12-22 LAB — CBC WITH DIFFERENTIAL/PLATELET
Abs Immature Granulocytes: 0.01 10*3/uL (ref 0.00–0.07)
Basophils Absolute: 0.1 10*3/uL (ref 0.0–0.1)
Basophils Relative: 1 %
Eosinophils Absolute: 0.3 10*3/uL (ref 0.0–0.5)
Eosinophils Relative: 6 %
HCT: 40.2 % (ref 39.0–52.0)
Hemoglobin: 12.7 g/dL — ABNORMAL LOW (ref 13.0–17.0)
Immature Granulocytes: 0 %
Lymphocytes Relative: 25 %
Lymphs Abs: 1.4 10*3/uL (ref 0.7–4.0)
MCH: 28 pg (ref 26.0–34.0)
MCHC: 31.6 g/dL (ref 30.0–36.0)
MCV: 88.7 fL (ref 80.0–100.0)
Monocytes Absolute: 0.5 10*3/uL (ref 0.1–1.0)
Monocytes Relative: 8 %
Neutro Abs: 3.3 10*3/uL (ref 1.7–7.7)
Neutrophils Relative %: 60 %
Platelets: 255 10*3/uL (ref 150–400)
RBC: 4.53 MIL/uL (ref 4.22–5.81)
RDW: 12.3 % (ref 11.5–15.5)
WBC: 5.5 10*3/uL (ref 4.0–10.5)
nRBC: 0 % (ref 0.0–0.2)

## 2021-12-22 LAB — PROTIME-INR
INR: 1.2 (ref 0.8–1.2)
Prothrombin Time: 15.1 seconds (ref 11.4–15.2)

## 2021-12-22 LAB — RESP PANEL BY RT-PCR (FLU A&B, COVID) ARPGX2
Influenza A by PCR: NEGATIVE
Influenza B by PCR: NEGATIVE
SARS Coronavirus 2 by RT PCR: NEGATIVE

## 2021-12-22 LAB — CBG MONITORING, ED
Glucose-Capillary: 83 mg/dL (ref 70–99)
Glucose-Capillary: 130 mg/dL — ABNORMAL HIGH (ref 70–99)
Glucose-Capillary: 190 mg/dL — ABNORMAL HIGH (ref 70–99)
Glucose-Capillary: 173 mg/dL — ABNORMAL HIGH (ref 70–99)

## 2021-12-22 LAB — APTT: aPTT: 24 seconds (ref 24–36)

## 2021-12-22 LAB — BRAIN NATRIURETIC PEPTIDE: B Natriuretic Peptide: 2444.6 pg/mL — ABNORMAL HIGH (ref 0.0–100.0)

## 2021-12-22 MED ORDER — MORPHINE SULFATE (PF) 4 MG/ML IV SOLN
4.0000 mg | Freq: Once | INTRAVENOUS | Status: DC
  Filled 2021-12-22: qty 1

## 2021-12-22 MED ORDER — INSULIN ASPART 100 UNIT/ML IJ SOLN
0.0000 [IU] | Freq: Three times a day (TID) | INTRAMUSCULAR | Status: DC
  Administered 2021-12-23: 8 [IU] via SUBCUTANEOUS
  Administered 2021-12-23: 11 [IU] via SUBCUTANEOUS
  Administered 2021-12-24 (×2): 8 [IU] via SUBCUTANEOUS
  Administered 2021-12-24: 11 [IU] via SUBCUTANEOUS
  Administered 2021-12-25: 5 [IU] via SUBCUTANEOUS
  Administered 2021-12-25: 2 [IU] via SUBCUTANEOUS
  Administered 2021-12-25: 3 [IU] via SUBCUTANEOUS
  Filled 2021-12-22 (×8): qty 1

## 2021-12-22 MED ORDER — EZETIMIBE 10 MG PO TABS
10.0000 mg | ORAL_TABLET | Freq: Every day | ORAL | Status: DC
  Administered 2021-12-23 – 2021-12-25 (×3): 10 mg via ORAL
  Filled 2021-12-22 (×3): qty 1

## 2021-12-22 MED ORDER — HEPARIN BOLUS VIA INFUSION
4000.0000 [IU] | Freq: Once | INTRAVENOUS | Status: DC
  Filled 2021-12-22: qty 4000

## 2021-12-22 MED ORDER — NITROGLYCERIN 0.4 MG SL SUBL: 0.4000 mg | SUBLINGUAL_TABLET | SUBLINGUAL | Status: DC | PRN

## 2021-12-22 MED ORDER — INSULIN ASPART PROT & ASPART (70-30 MIX) 100 UNIT/ML ~~LOC~~ SUSP
20.0000 [IU] | Freq: Two times a day (BID) | SUBCUTANEOUS | Status: DC
  Administered 2021-12-23: 20 [IU] via SUBCUTANEOUS
  Filled 2021-12-22 (×2): qty 10

## 2021-12-22 MED ORDER — HEPARIN (PORCINE) 25000 UT/250ML-% IV SOLN: 900.0000 [IU]/h | INTRAVENOUS | Status: DC

## 2021-12-22 MED ORDER — ASPIRIN 81 MG PO TBEC
81.0000 mg | DELAYED_RELEASE_TABLET | Freq: Every day | ORAL | Status: DC
  Administered 2021-12-23 – 2021-12-25 (×3): 81 mg via ORAL
  Filled 2021-12-22 (×3): qty 1

## 2021-12-22 MED ORDER — PRASUGREL HCL 10 MG PO TABS
10.0000 mg | ORAL_TABLET | Freq: Every day | ORAL | Status: DC
  Administered 2021-12-23 – 2021-12-25 (×3): 10 mg via ORAL
  Filled 2021-12-22 (×3): qty 1

## 2021-12-22 MED ORDER — NITROGLYCERIN 2 % TD OINT
1.0000 [in_us] | TOPICAL_OINTMENT | Freq: Once | TRANSDERMAL | Status: AC
  Administered 2021-12-22: 1 [in_us] via TOPICAL
  Filled 2021-12-22: qty 1

## 2021-12-22 MED ORDER — FUROSEMIDE 40 MG PO TABS
40.0000 mg | ORAL_TABLET | Freq: Two times a day (BID) | ORAL | Status: DC
  Administered 2021-12-23: 40 mg via ORAL
  Filled 2021-12-22: qty 1

## 2021-12-22 MED ORDER — OMEPRAZOLE MAGNESIUM 20 MG PO TBEC: 20.0000 mg | DELAYED_RELEASE_TABLET | Freq: Every day | ORAL | Status: DC

## 2021-12-22 MED ORDER — PANTOPRAZOLE SODIUM 40 MG PO TBEC
40.0000 mg | DELAYED_RELEASE_TABLET | Freq: Every day | ORAL | Status: DC
  Administered 2021-12-23 – 2021-12-25 (×3): 40 mg via ORAL
  Filled 2021-12-22 (×3): qty 1

## 2021-12-22 MED ORDER — INSULIN ASPART 100 UNIT/ML IJ SOLN
0.0000 [IU] | Freq: Every day | INTRAMUSCULAR | Status: DC
  Administered 2021-12-24: 5 [IU] via SUBCUTANEOUS
  Filled 2021-12-22: qty 1

## 2021-12-22 MED ORDER — SPIRONOLACTONE 25 MG PO TABS
25.0000 mg | ORAL_TABLET | Freq: Every day | ORAL | Status: DC
  Administered 2021-12-23 – 2021-12-25 (×3): 25 mg via ORAL
  Filled 2021-12-22 (×3): qty 1

## 2021-12-22 MED ORDER — ASPIRIN 81 MG PO CHEW
243.0000 mg | CHEWABLE_TABLET | Freq: Once | ORAL | Status: AC
  Administered 2021-12-22: 243 mg via ORAL
  Filled 2021-12-22: qty 3

## 2021-12-22 MED ORDER — HEPARIN BOLUS VIA INFUSION
4000.0000 [IU] | Freq: Once | INTRAVENOUS | Status: AC
  Administered 2021-12-22: 4000 [IU] via INTRAVENOUS
  Filled 2021-12-22: qty 4000

## 2021-12-22 MED ORDER — ROSUVASTATIN CALCIUM 10 MG PO TABS
40.0000 mg | ORAL_TABLET | Freq: Every day | ORAL | Status: DC
  Administered 2021-12-23 – 2021-12-25 (×3): 40 mg via ORAL
  Filled 2021-12-22 (×2): qty 2
  Filled 2021-12-22: qty 4

## 2021-12-22 MED ORDER — CARVEDILOL 12.5 MG PO TABS
12.5000 mg | ORAL_TABLET | Freq: Two times a day (BID) | ORAL | Status: DC
  Administered 2021-12-23 – 2021-12-25 (×6): 12.5 mg via ORAL
  Filled 2021-12-22 (×2): qty 2
  Filled 2021-12-22 (×2): qty 1
  Filled 2021-12-22 (×2): qty 2

## 2021-12-22 MED ORDER — TAMSULOSIN HCL 0.4 MG PO CAPS
0.4000 mg | ORAL_CAPSULE | Freq: Every day | ORAL | Status: DC
  Administered 2021-12-23 – 2021-12-25 (×3): 0.4 mg via ORAL
  Filled 2021-12-22 (×3): qty 1

## 2021-12-22 MED ORDER — ACETAMINOPHEN 325 MG PO TABS
650.0000 mg | ORAL_TABLET | ORAL | Status: DC | PRN
  Administered 2021-12-24: 650 mg via ORAL
  Filled 2021-12-22: qty 2

## 2021-12-22 MED ORDER — ONDANSETRON HCL 4 MG/2ML IJ SOLN: 4.0000 mg | Freq: Four times a day (QID) | INTRAMUSCULAR | Status: DC | PRN

## 2021-12-22 MED ORDER — HEPARIN (PORCINE) 25000 UT/250ML-% IV SOLN
1350.0000 [IU]/h | INTRAVENOUS | Status: DC
  Administered 2021-12-22: 900 [IU]/h via INTRAVENOUS
  Administered 2021-12-23 – 2021-12-25 (×3): 1350 [IU]/h via INTRAVENOUS
  Filled 2021-12-22 (×4): qty 250

## 2021-12-22 MED ORDER — SACUBITRIL-VALSARTAN 97-103 MG PO TABS
1.0000 | ORAL_TABLET | Freq: Two times a day (BID) | ORAL | Status: DC
  Administered 2021-12-23 – 2021-12-25 (×5): 1 via ORAL
  Filled 2021-12-22 (×6): qty 1

## 2021-12-22 NOTE — Consult Note (Signed)
ANTICOAGULATION CONSULT NOTE - Initial Consult  Pharmacy Consult for Heparin infusion Indication: chest pain/ACS  No Known Allergies  Patient Measurements: Height: 5\' 7"  (170.2 cm) Weight: 81.6 kg (180 lb) IBW/kg (Calculated) : 66.1 Heparin Dosing Weight: 81.6 kg  Vital Signs: Temp: 98.1 F (36.7 C) (10/13 1900) Temp Source: Oral (10/13 1900) BP: 146/100 (10/13 2000) Pulse Rate: 90 (10/13 2000)  Labs: Recent Labs    12/22/21 1859  HGB 12.7*  HCT 40.2  PLT 255  CREATININE 2.34*  TROPONINIHS 40*    Estimated Creatinine Clearance: 35.6 mL/min (A) (by C-G formula based on SCr of 2.34 mg/dL (H)).   Medical History: Past Medical History:  Diagnosis Date   Coronary artery disease    Diabetes mellitus without complication (HCC)    High cholesterol    Hypertension    Myocardial infarct [facility])     Medications:  (Not in a hospital admission)   Assessment: Patient admitted with chest pain. Pharmacy consulted to manage heparin infusion for ACS. No hx of AC prior to admission noted.   Baseline aPTT and INR ordered per protocol. Baseline CBC appropriate to initiate heparin. Scr 2.34  Goal of Therapy:  Heparin level 0.3-0.7 units/ml Monitor platelets by anticoagulation protocol: Yes   Plan:  Give 4000 units bolus x 1 Start heparin infusion at 900 units/hr Check anti-Xa level in 6 hours and daily while on heparin Continue to monitor H&H and platelets  Kiannah Grunow Rodriguez-Guzman PharmD, BCPS 12/22/2021 9:51 PM

## 2021-12-22 NOTE — ED Notes (Signed)
Upon taking pt to room with spouse, this RN requested assistance from [place], [place] to get patient out of wheelchair and into bed. While wheeling pt from triage to room pt began leaning forward out of wheelchair and required RN to assist with righting pt and holding shoulders to ensure he would not fall out of chair. Primary RN met this one in room to assist and was requesting report regarding pt. Pt was able to stand self up from wheelchair and transfer into bed with standby support and assistance from RN's. Pt still unable to comply with answering questions and only follows commands with repeated prompting.   Once pt was in bed this RN attempted to review triage and assessment with primary. This RN reported pt was reporting CP and SOB as initial complaint. RN also relayed that while triage was being conducted pt was having notable difficulty speaking, following commands, focusing eyes, or engaging with RN. As charted in prior note, this RN called provider into triage room for assessment of pt to determine if a CT scan needed to be performed with concern for neurological compromise. Triage provider had not yet put in an order for CT. This RN also reported that pt blood sugar had been assessed and was found to be 83.   While this RN was attempting to convey information and treatment thus far to primary, pt wife interjected and began yelling at this RN. Wife yelling "you're talking like he's crazy, he's not crazy his sugar is low." This RN attempted to educate wife that she was not intending to be offensive, only offer assessment to this point to primary, and that pt blood sugar is technically WNL though possibly low for pt. Pt spouse continues to yell at this RN despite this RN apologizing for any unintended offence. This RN continued to try to educate spouse regarding concerns and informed spouse that these concern for pt is why RN expedited getting pt to a room for further evaluation and monitoring by staff and  physician. Pt spouse remained angry and yelling and refusing explanation or teaching from this or primary RN. Spouse continually stating "he's not having a stroke he needs sugar." Again, RN attempted to explain that we can address the sugar but she still feels it may be pertinent to have provider examine pt to discuss possibility of need for CT as pt is seemingly unable to follow commands, has diminished coordination, and remains with difficulty answering questions or focusing vision. Teaching/explanation again refused.   This RN then exited room to get orange juice for pt. This RN returned to room with 2 containers of orange juice in a cup with 2 sugar packets added. Pt consumed entirety of provided juice. Upon completion of juice this RN returned to triage. Pt on full cardiac monitor upon this RN exit from room. Spouse at bedside.

## 2021-12-22 NOTE — Assessment & Plan Note (Addendum)
CAD, NSTEMI 11/14/21  With DES x2 to RCA, STEMI 2021 with stent to RCA,   Continue heparin infusion Continue aspirin, carvedilol, ezetimibe, rosuvastatin, Nitroglycerin sublingual as needed chest pain with morphine for breakthrough Prasugrel seen on discharge summary from [facility] but not on current med list Cardiology consult

## 2021-12-22 NOTE — ED Notes (Signed)
Pt wife requesting more orange juice.

## 2021-12-22 NOTE — Assessment & Plan Note (Signed)
Ischemic cardiomyopathy, EF 35 to 40% 12/13/2021 Appears clinically euvolemic but BNP elevated to 2446.  Chest x-ray clear Continue GDMT with Entresto 97/103, Imdur 30 mg, eplerenone 25 mg daily, carvedilol 12.5 mg daily Continue Lasix

## 2021-12-22 NOTE — Telephone Encounter (Signed)
Patient did not show for his initial [facility] Failure [facility] appointment on 12/22/21. Will attempt to reschedule.

## 2021-12-22 NOTE — ED Provider Notes (Signed)
[facility] Provider Note    Event Date/Time   First MD Initiated Contact with Patient 12/22/21 1941     (approximate)   History   Chest Pain and Shortness of Breath   HPI  Mark Brooks is a 56 y.o. male with history of CHF, CAD, here with chest pain and shortness of breath.  The patient is somewhat tearful on exam.  He states that he is concerned he is having another heart attack.  He states that he has had a dull, aching, substernal chest pressure for the last 24 hours or so.  It was intermittent but has been constant more recently.  He states he has had shortness of breath with this.  He was recently hospitalized for CHF exacerbation and states that he feels like his fluid is actually improved.  He has a history of NSTEMI and stents with similar symptoms.  He also has been under significantly increased stress recently.     Physical Exam   Triage Vital Signs: ED Triage Vitals  Enc Vitals Group     BP 12/22/21 1900 (!) 134/92     Pulse Rate 12/22/21 1900 67     Resp 12/22/21 1900 (!) 24     Temp 12/22/21 1900 98.1 F (36.7 C)     Temp Source 12/22/21 1900 Oral     SpO2 12/22/21 1900 98 %     Weight 12/22/21 1919 180 lb (81.6 kg)     Height 12/22/21 1919 5\' 7"  (1.702 m)     Head Circumference --      Peak Flow --      Pain Score 12/22/21 1919 2     Pain Loc --      Pain Edu? --      Excl. in GC? --     Most recent vital signs: Vitals:   12/22/21 2200 12/22/21 2300  BP: (!) 152/92 136/82  Pulse: (!) 120 87  Resp: 17 (!) 21  Temp:    SpO2: 99% 97%     General: Awake, no distress.  CV:  Good peripheral perfusion.  Resp:  Normal effort.  Bibasilar rales, overall normal aeration and work of breathing however. Abd:  No distention.  Other:  Trace edema.   ED Results / Procedures / Treatments   Labs (all labs ordered are listed, but only abnormal results are displayed) Labs Reviewed  CBC WITH DIFFERENTIAL/PLATELET - Abnormal; Notable  for the following components:      Result Value   Hemoglobin 12.7 (*)    All other components within normal limits  COMPREHENSIVE METABOLIC PANEL - Abnormal; Notable for the following components:   BUN 27 (*)    Creatinine, Ser 2.34 (*)    Calcium 8.5 (*)    Albumin 3.0 (*)    GFR, Estimated 32 (*)    All other components within normal limits  BRAIN NATRIURETIC PEPTIDE - Abnormal; Notable for the following components:   B Natriuretic Peptide 2,444.6 (*)    All other components within normal limits  CBG MONITORING, ED - Abnormal; Notable for the following components:   Glucose-Capillary 130 (*)    All other components within normal limits  CBG MONITORING, ED - Abnormal; Notable for the following components:   Glucose-Capillary 190 (*)    All other components within normal limits  CBG MONITORING, ED - Abnormal; Notable for the following components:   Glucose-Capillary 173 (*)    All other components within normal limits  TROPONIN I (  HIGH SENSITIVITY) - Abnormal; Notable for the following components:   Troponin I (High Sensitivity) 40 (*)    All other components within normal limits  TROPONIN I (HIGH SENSITIVITY) - Abnormal; Notable for the following components:   Troponin I (High Sensitivity) 34 (*)    All other components within normal limits  RESP PANEL BY RT-PCR (FLU A&B, COVID) ARPGX2  PROTIME-INR  APTT  HEPARIN LEVEL (UNFRACTIONATED)  CBG MONITORING, ED     EKG Normal sinus rhythm with PVCs.  Ventricular rate 87.  PR 190, QRS 84, QTc 474.  Nonspecific T wave changes.  No ST elevations.   RADIOLOGY Chest x-ray: No active disease   I also independently reviewed and agree with radiologist interpretations.   PROCEDURES:  Critical Care performed: Yes, see critical care procedure note(s)  .Critical Care  Performed by: Duffy Bruce, MD Authorized by: Duffy Bruce, MD   Critical care provider statement:    Critical care time (minutes):  30   Critical care  time was exclusive of:  Separately billable procedures and treating other patients   Critical care was necessary to treat or prevent imminent or life-threatening deterioration of the following conditions:  Cardiac failure, circulatory failure and respiratory failure   Critical care was time spent personally by me on the following activities:  Development of treatment plan with patient or surrogate, discussions with consultants, evaluation of patient's response to treatment, examination of patient, ordering and review of laboratory studies, ordering and review of radiographic studies, ordering and performing treatments and interventions, pulse oximetry, re-evaluation of patient's condition and review of old [place] [facility] ED: Medications  morphine (PF) 4 MG/ML injection 4 mg (4 mg Intravenous Patient Refused/Not Given 12/22/21 2009)  heparin ADULT infusion 100 units/mL (25000 units/243mL) (900 Units/hr Intravenous New Bag/Given 12/22/21 2302)  aspirin EC tablet 81 mg (has no administration in time range)  carvedilol (COREG) tablet 12.5 mg (has no administration in time range)  spironolactone (ALDACTONE) tablet 25 mg (has no administration in time range)  ezetimibe (ZETIA) tablet 10 mg (has no administration in time range)  furosemide (LASIX) tablet 40 mg (has no administration in time range)  rosuvastatin (CRESTOR) tablet 40 mg (has no administration in time range)  sacubitril-valsartan (ENTRESTO) 97-103 mg per tablet (has no administration in time range)  insulin aspart protamine- aspart (NOVOLOG MIX 70/30) injection 20 Units (has no administration in time range)  tamsulosin (FLOMAX) capsule 0.4 mg (has no administration in time range)  prasugrel (EFFIENT) tablet 10 mg (has no administration in time range)  nitroGLYCERIN (NITROSTAT) SL tablet 0.4 mg (has no administration in time range)  acetaminophen (TYLENOL) tablet 650 mg (has no administration in time range)  ondansetron  (ZOFRAN) injection 4 mg (has no administration in time range)  insulin aspart (novoLOG) injection 0-15 Units (has no administration in time range)  insulin aspart (novoLOG) injection 0-5 Units (has no administration in time range)  pantoprazole (PROTONIX) EC tablet 40 mg (has no administration in time range)  aspirin chewable tablet 243 mg (243 mg Oral Given 12/22/21 2009)  nitroGLYCERIN (NITROGLYN) 2 % ointment 1 inch (1 inch Topical Given 12/22/21 2009)  heparin bolus via infusion 4,000 Units (4,000 Units Intravenous Bolus from Bag 12/22/21 2303)     IMPRESSION / MDM / Cache / ED COURSE  I reviewed the triage vital signs and the nursing notes.  The patient is on the cardiac monitor to evaluate for evidence of arrhythmia and/or significant heart rate changes.   Ddx:  Differential includes the following, with pertinent life- or limb-threatening emergencies considered:  NSTEMI, CHF, unstable angina, unlikely PE, dissection, GERD  Patient's presentation is most consistent with acute presentation with potential threat to life or bodily function.  MDM:  56 year old male with history of CHF, CAD status post recent stenting in September at [facility], here with chest pain and shortness of breath.  Patient states this feels similar to his previous NSTEMI.  EKG is nonischemic and troponins are stable, but patient has significant risk factors with recent stenting within the last month, will start on empiric heparin and plan to admit to medicine.  Patient feels slightly improved after morphine.  Lab work reviewed, he does have elevated BNP but this is below his last value and clinically does not appear overtly hypervolemic.  He has CKD which is near baseline.  Chest x-ray is clear.   MEDICATIONS GIVEN IN ED: Medications  morphine (PF) 4 MG/ML injection 4 mg (4 mg Intravenous Patient Refused/Not Given 12/22/21 2009)  heparin ADULT infusion 100 units/mL  (25000 units/244mL) (900 Units/hr Intravenous New Bag/Given 12/22/21 2302)  aspirin EC tablet 81 mg (has no administration in time range)  carvedilol (COREG) tablet 12.5 mg (has no administration in time range)  spironolactone (ALDACTONE) tablet 25 mg (has no administration in time range)  ezetimibe (ZETIA) tablet 10 mg (has no administration in time range)  furosemide (LASIX) tablet 40 mg (has no administration in time range)  rosuvastatin (CRESTOR) tablet 40 mg (has no administration in time range)  sacubitril-valsartan (ENTRESTO) 97-103 mg per tablet (has no administration in time range)  insulin aspart protamine- aspart (NOVOLOG MIX 70/30) injection 20 Units (has no administration in time range)  tamsulosin (FLOMAX) capsule 0.4 mg (has no administration in time range)  prasugrel (EFFIENT) tablet 10 mg (has no administration in time range)  nitroGLYCERIN (NITROSTAT) SL tablet 0.4 mg (has no administration in time range)  acetaminophen (TYLENOL) tablet 650 mg (has no administration in time range)  ondansetron (ZOFRAN) injection 4 mg (has no administration in time range)  insulin aspart (novoLOG) injection 0-15 Units (has no administration in time range)  insulin aspart (novoLOG) injection 0-5 Units (has no administration in time range)  pantoprazole (PROTONIX) EC tablet 40 mg (has no administration in time range)  aspirin chewable tablet 243 mg (243 mg Oral Given 12/22/21 2009)  nitroGLYCERIN (NITROGLYN) 2 % ointment 1 inch (1 inch Topical Given 12/22/21 2009)  heparin bolus via infusion 4,000 Units (4,000 Units Intravenous Bolus from Bag 12/22/21 2303)     Consults:  Hospitalist   EMR reviewed  Reviewed prior cath results from [facility] in September, DES to RCA x2.     FINAL CLINICAL IMPRESSION(S) / ED DIAGNOSES   Final diagnoses:  Unstable angina (HCC)  Chest pain with high risk for cardiac etiology     Rx / DC Orders   ED Discharge Orders     None        Note:  This  document was prepared using Dragon voice recognition software and may include unintentional dictation errors.   Shaune Pollack, MD 12/23/21 [phone]

## 2021-12-22 NOTE — ED Notes (Signed)
PEN PAD IN ROOM NOT FUNCTIONAL. PT AND SPOUSE PROVIDE VERBAL CONSENT AND UNDERSTANDING FOR MSE FORM

## 2021-12-22 NOTE — H&P (Signed)
History and Physical    Patient: Mark Brooks JJO:[id number] DOB: [birth year] DOA: 12/22/2021 DOS: the patient was seen and examined on 12/22/2021 PCP: Patient, No Pcp Per  Patient coming from: Home  Chief Complaint:  Chief Complaint  Patient presents with   Chest Pain   Shortness of Breath    HPI: Mark Brooks is a 56 y.o. male with medical history significant for Type 1 diabetes, CKD 3B, 3 vessel CAD, HFrEF, prior STEMI 2021 with stent to RCA with recent admission to [place] 9/5 to 9/8 for NSTEMI where he underwent left heart cath with DES x2 to RCA with TTE showing LVEF of 40%, discharged on DAPT and multiple GDMT meds who presents to the ED by EMS after he was found on the ground at work complaining of chest pain and shortness of breath. Of note, patient was more recently hospitalized at this facility from 10/4 to 10/6 with CHF exacerbation believed related to excessive fluid intake with echo on 10/4 showing EF 35 to 40% whereupon his Entresto was increased. On presentation his complaints of chest pain, weakness and shortness of breath and feeling like his blood sugar was low.  He had a cough and denied fever and chills.  Denied lower extremity edema. ED course and data review:BP 134/92, tachypneic to 24 with O2 sat 98% on room air.  Pulse 67 and afebrile.  Troponin 40-34 and BNP 2446.  Creatinine at baseline at 2.34 EKG, personally viewed and interpreted showing sinus at 87 with T wave inversion in lateral leads.  Chest x-ray with no active cardiopulmonary disease. Patient treated with nitroglycerin ointment and aspirin with improvement in chest pain and started on a heparin infusion.  Hospitalist consulted for admission.   Review of Systems: As mentioned in the history of present illness. All other systems reviewed and are negative.  Past Medical History:  Diagnosis Date   Coronary artery disease    Diabetes mellitus without complication (HCC)    High cholesterol    Hypertension     Myocardial infarct [facility])    Past Surgical History:  Procedure Laterality Date   PERCUTANEOUS CORONARY STENT INTERVENTION (PCI-S)     Social History:  reports that he has been smoking cigarettes. He has never used smokeless tobacco. He reports that he does not currently use alcohol. He reports that he does not use drugs.  No Known Allergies  Family History  Problem Relation Age of Onset   Diabetes Mellitus II Mother    Heart disease Father     Prior to Admission medications   Medication Sig Start Date End Date Taking? Authorizing Provider  acetaminophen (TYLENOL) 500 MG tablet Take 500 mg by mouth every 6 (six) hours as needed.    [provider]  aspirin EC 81 MG tablet Take 81 mg by mouth daily. Swallow whole.    [provider]  carvedilol (COREG) 12.5 MG tablet Take 12.5 mg by mouth 2 (two) times daily. 11/09/21   [provider]  dorzolamide-timolol (COSOPT) 22.3-6.8 MG/ML ophthalmic solution Place 1 drop into the right eye 2 (two) times daily. 01/15/20   [provider]  eplerenone (INSPRA) 25 MG tablet Take 25 mg by mouth daily.    [provider]  ezetimibe (ZETIA) 10 MG tablet Take 10 mg by mouth daily. 11/23/21   [provider]  furosemide (LASIX) 40 MG tablet Take 1 tablet (40 mg total) by mouth 2 (two) times daily. Increase to 1 tablet (40 mg total) by mouth THREE  TIMES daily (total daily dose 120 mg) as needed for up to 3 days for increased leg swelling, shortness of breath, weight gain 5+ lbs over 1-2 days. Seek medical care if these symptoms are not improving with increased dose. 12/15/21   Emeterio Reeve, DO  insulin NPH-regular Human (70-30) 100 UNIT/ML injection Inject 20-25 Units into the skin 2 (two) times daily. (based upon blood sugar reading)    [provider]  isosorbide mononitrate (IMDUR) 30 MG 24 hr tablet Take 1 tablet (30 mg total) by mouth daily. 12/15/21 01/14/22  Emeterio Reeve, DO   nitroGLYCERIN (NITROSTAT) 0.4 MG SL tablet Place 1 tablet (0.4 mg total) under the tongue every 5 (five) minutes as needed for chest pain. 12/15/21   Emeterio Reeve, DO  omeprazole (PRILOSEC OTC) 20 MG tablet Take 20 mg by mouth daily.    [provider]  prasugrel (EFFIENT) 10 MG TABS tablet Take 10 mg by mouth daily.    [provider]  rosuvastatin (CRESTOR) 40 MG tablet Take 1 tablet (40 mg total) by mouth daily. 12/15/21   Emeterio Reeve, DO  sacubitril-valsartan (ENTRESTO) 97-103 MG Take 1 tablet by mouth 2 (two) times daily. 12/15/21   Emeterio Reeve, DO  tamsulosin (FLOMAX) 0.4 MG CAPS capsule Take 1 capsule (0.4 mg total) by mouth daily. 12/15/21   Emeterio Reeve, DO    Physical Exam: Vitals:   12/22/21 1900 12/22/21 1919 12/22/21 1935 12/22/21 2000  BP: (!) 134/92  (!) 146/95 (!) 146/100  Pulse: 67  99 90  Resp: (!) 24   16  Temp: 98.1 F (36.7 C)     TempSrc: Oral     SpO2: 98%  100% 100%  Weight:  81.6 kg    Height:  5\' 7"  (1.702 m)     Physical Exam Vitals and nursing note reviewed.  Constitutional:      General: He is not in acute distress. HENT:     Head: Normocephalic and atraumatic.  Cardiovascular:     Rate and Rhythm: Normal rate and regular rhythm.     Heart sounds: Normal heart sounds.  Pulmonary:     Effort: Pulmonary effort is normal.     Breath sounds: Normal breath sounds.  Abdominal:     Palpations: Abdomen is soft.     Tenderness: There is no abdominal tenderness.  Neurological:     Mental Status: Mental status is at baseline.     Labs on Admission: I have personally reviewed following labs and imaging studies  CBC: Recent Labs  Lab 12/22/21 1859  WBC 5.5  NEUTROABS 3.3  HGB 12.7*  HCT 40.2  MCV 88.7  PLT 123456   Basic Metabolic Panel: Recent Labs  Lab 12/22/21 1859  NA 140  K 3.9  CL 105  CO2 27  GLUCOSE 83  BUN 27*  CREATININE 2.34*  CALCIUM 8.5*   GFR: Estimated Creatinine Clearance: 35.6  mL/min (A) (by C-G formula based on SCr of 2.34 mg/dL (H)). Liver Function Tests: Recent Labs  Lab 12/22/21 1859  AST 22  ALT 13  ALKPHOS 50  BILITOT 0.7  PROT 6.7  ALBUMIN 3.0*   No results for input(s): "LIPASE", "AMYLASE" in the last 168 hours. No results for input(s): "AMMONIA" in the last 168 hours. Coagulation Profile: Recent Labs  Lab 12/22/21 2204  INR 1.2   Cardiac Enzymes: No results for input(s): "CKTOTAL", "CKMB", "CKMBINDEX", "TROPONINI" in the last 168 hours. BNP (last 3 results) No results for input(s): "PROBNP" in  the last 8760 hours. HbA1C: No results for input(s): "HGBA1C" in the last 72 hours. CBG: Recent Labs  Lab 12/22/21 1927 12/22/21 2017 12/22/21 2115 12/22/21 2309  GLUCAP 83 130* 190* 173*   Lipid Profile: No results for input(s): "CHOL", "HDL", "LDLCALC", "TRIG", "CHOLHDL", "LDLDIRECT" in the last 72 hours. Thyroid Function Tests: No results for input(s): "TSH", "T4TOTAL", "FREET4", "T3FREE", "THYROIDAB" in the last 72 hours. Anemia Panel: No results for input(s): "VITAMINB12", "FOLATE", "FERRITIN", "TIBC", "IRON", "RETICCTPCT" in the last 72 hours. Urine analysis: No results found for: "COLORURINE", "APPEARANCEUR", "LABSPEC", "PHURINE", "GLUCOSEU", "HGBUR", "BILIRUBINUR", "KETONESUR", "PROTEINUR", "UROBILINOGEN", "NITRITE", "LEUKOCYTESUR"  Radiological Exams on Admission: DG Chest 2 View  Result Date: 12/22/2021 CLINICAL DATA:  Chest pain and shortness of breath. EXAM: CHEST - 2 VIEW COMPARISON:  December 14, 2021 FINDINGS: The heart size and mediastinal contours are within normal limits. There is no evidence of acute infiltrate, pleural effusion or pneumothorax. The visualized skeletal structures are unremarkable. IMPRESSION: No active cardiopulmonary disease. Electronically Signed   By: Virgina Norfolk M.D.   On: 12/22/2021 19:18     Data Reviewed: Relevant notes from primary care and specialist visits, past discharge summaries as  available in EHR, including Care Everywhere. Prior diagnostic testing as pertinent to current admission diagnoses Updated medications and problem lists for reconciliation ED course, including vitals, labs, imaging, treatment and response to treatment Triage notes, nursing and pharmacy notes and ED provider's notes Notable results as noted in HPI   Assessment and Plan: * Unstable angina ([place]) CAD, NSTEMI 11/14/21  With DES x2 to RCA, STEMI 2021 with stent to RCA,   Continue heparin infusion Continue aspirin, carvedilol, ezetimibe, rosuvastatin, Nitroglycerin sublingual as needed chest pain with morphine for breakthrough Prasugrel seen on discharge summary from [facility] but not on current med list Cardiology consult  Type 1 diabetes mellitus ([place]) Continue NPH 70/30 with sliding scale coverage  Stage 3b chronic kidney disease (CKD) ([place]) Renal function at baseline  Chronic systolic CHF (congestive heart failure) ([place]) Ischemic cardiomyopathy, EF 35 to 40% 12/13/2021 Appears clinically euvolemic but BNP elevated to 2446.  Chest x-ray clear Continue GDMT with Entresto 97/103, Imdur 30 mg, eplerenone 25 mg daily, carvedilol 12.5 mg daily Continue Lasix        DVT prophylaxis: Lovenox  Consults: Cardiology, [facility]  Advance Care Planning:   Code Status: Prior   Family Communication: none  Disposition Plan: Back to previous home environment  Severity of Illness: The appropriate patient status for this patient is INPATIENT. Inpatient status is judged to be reasonable and necessary in order to provide the required intensity of service to ensure the patient's safety. The patient's presenting symptoms, physical exam findings, and initial radiographic and laboratory data in the context of their chronic comorbidities is felt to place them at high risk for further clinical deterioration. Furthermore, it is not anticipated that the patient will be medically stable for discharge from the hospital  within 2 midnights of admission.   * I certify that at the point of admission it is my clinical judgment that the patient will require inpatient hospital care spanning beyond 2 midnights from the point of admission due to high intensity of service, high risk for further deterioration and high frequency of surveillance required.*  Author: Athena Masse, MD 12/22/2021 11:11 PM  For on call review [email or web address].

## 2021-12-22 NOTE — Assessment & Plan Note (Signed)
Renal function at baseline 

## 2021-12-22 NOTE — ED Notes (Addendum)
PT to room appearing lethargic and drowsy. Pt assisted to bed with assistance x2. Pt wife, hostile stating that the patient sugar is low and they need sugar. Pt able to stand. Pt slurring words attempting to point at mouth.  Noah Delaine stating per parameters patient sugar was normal 83. Wife stating that is low for him, "he runs in the 300's". Patient wife hostile stating "he's not crazy, he need sugar" pt making it challenging for this rn to receive report. Pt grabbing at me, saying "I need something to eat".   Patient given orange juice with sugar per [facility] [employer].   This RN stating that the patient may meet concerns for stroke wife interjected "He's not having a stroke, he needs sugar."

## 2021-12-22 NOTE — Assessment & Plan Note (Signed)
Continue NPH 70/30 with sliding scale coverage

## 2021-12-22 NOTE — ED Notes (Addendum)
Pt with difficulty sitting up, following commands, speaking, or focusing vision. RN called PA Erlene Quan to room for repeat evaluation. PA reviewing pt EKG and results to this point. RN obtaining a bgl at this time. RN inquired about need for head CT. No new orders at this time.

## 2021-12-22 NOTE — ED Triage Notes (Signed)
Spouse reports pt was walking across a parking lot and got down onto grown d/t chest pain and SOB. Reports weakness as well. Hx of CHF. Pt reports continued CP, SOB, and weakness. Pt quiet in triage and not responsive to questions. Pt gestures to wife for answers to assessment questions and does not speak without repeated prompting. Pt is noted to be breathing unlabored with symmetric chest rise and fall.

## 2021-12-22 NOTE — ED Provider Triage Note (Signed)
  Emergency Medicine Provider Triage Evaluation Note  Mark Brooks , a 56 y.o.male,  was evaluated in triage.  Pt complains of chest pain/shortness of breath that started earlier today.  Patient was reportedly found on the ground at work due to chest pain and shortness of breath.  Denies head injury or LOC.  He does have a history of several heart attacks and has had stents placed.  Reports a nonproductive cough as well.  He received 324 mg aspirin and 1 nitro spray in route.   Review of Systems  Positive: Chest pain, shortness of breath Negative: Denies fever, chest pain, vomiting  Physical Exam  There were no vitals filed for this visit. Gen:   Awake, appears fatigued Resp:  Normal effort  MSK:   Moves extremities without difficulty  Other:    Medical Decision Making  Given the patient's initial medical screening exam, the following diagnostic evaluation has been ordered. The patient will be placed in the appropriate treatment space, once one is available, to complete the evaluation and treatment. I have discussed the plan of care with the patient and I have advised the patient that an ED physician or mid-level practitioner will reevaluate their condition after the test results have been received, as the results may give them additional insight into the type of treatment they may need.    Diagnostics: Labs, CXR, EKG  Treatments: none immediately   Teodoro Spray, [place] 12/22/21 1841

## 2021-12-22 NOTE — ED Triage Notes (Signed)
First nurse note: Patient arrived by EMS from work. Patient was found on ground at work due to cp and sob. Denies LOC. Denies hitting head. History heart stents. Reports non productive cough.  20G L AC   EMS gave 324 aspirin and 1 spray nitro  EMS vitals: 141/94 b/p 88P 97% RA 112CBG

## 2021-12-22 NOTE — Progress Notes (Deleted)
   Patient ID: Danta Baumgardner, male    DOB: 11/21/1965, 56 y.o.   MRN: [id number]  HPI  Mr Blasingame is a 56 y/o male with a history of  Echo report from 12/13/21 reviewed and showed an EF of 35-40% along with mild MR.   Admitted 12/13/21 due to shortness of breath due to acute on chronic HF. Initially given IV lasix but then needed a fluid bolus. Transition to oral diuretics. Entresto increased due to HTN. Elevated troponin thought to be due to HF exacerbation.       Discharged after 2 days. Admitted 11/14/21 due to   He presents today for his initial visit with a chief complaint of   Review of Systems    Physical Exam    Assessment & Plan:  1: Chronic heart failure with reduced ejection fraction- - NYHA class  2: HTN- - BP  3: T1 DM with CKD-

## 2021-12-22 NOTE — ED Notes (Signed)
Pt alert and oriented at this time, denying any chest pain.

## 2021-12-22 NOTE — ED Notes (Signed)
Pt ripping off bp cuff, asking for IV to be removed. Pt stating "I'm ready to go"  Md made aware.

## 2021-12-23 DIAGNOSIS — I200 Unstable angina: Secondary | ICD-10-CM | POA: Diagnosis not present

## 2021-12-23 LAB — HEPARIN LEVEL (UNFRACTIONATED)
Heparin Unfractionated: 0.49 IU/mL (ref 0.30–0.70)
Heparin Unfractionated: 0.24 IU/mL — ABNORMAL LOW (ref 0.30–0.70)
Heparin Unfractionated: 0.11 IU/mL — ABNORMAL LOW (ref 0.30–0.70)
Heparin Unfractionated: 0.13 IU/mL — ABNORMAL LOW (ref 0.30–0.70)
Heparin Unfractionated: 0.41 IU/mL (ref 0.30–0.70)

## 2021-12-23 LAB — CBG MONITORING, ED
Glucose-Capillary: 175 mg/dL — ABNORMAL HIGH (ref 70–99)
Glucose-Capillary: 349 mg/dL — ABNORMAL HIGH (ref 70–99)
Glucose-Capillary: 297 mg/dL — ABNORMAL HIGH (ref 70–99)
Glucose-Capillary: 102 mg/dL — ABNORMAL HIGH (ref 70–99)
Glucose-Capillary: 460 mg/dL — ABNORMAL HIGH (ref 70–99)

## 2021-12-23 LAB — TROPONIN I (HIGH SENSITIVITY): Troponin I (High Sensitivity): 28 ng/L — ABNORMAL HIGH (ref <18)

## 2021-12-23 MED ORDER — HEPARIN BOLUS VIA INFUSION
2400.0000 [IU] | Freq: Once | INTRAVENOUS | Status: AC
  Administered 2021-12-23: 2400 [IU] via INTRAVENOUS
  Filled 2021-12-23: qty 2400

## 2021-12-23 MED ORDER — INSULIN ASPART PROT & ASPART (70-30 MIX) 100 UNIT/ML ~~LOC~~ SUSP
30.0000 [IU] | Freq: Two times a day (BID) | SUBCUTANEOUS | Status: DC
  Administered 2021-12-24 – 2021-12-25 (×3): 30 [IU] via SUBCUTANEOUS
  Filled 2021-12-23 (×2): qty 10

## 2021-12-23 MED ORDER — ISOSORBIDE MONONITRATE ER 30 MG PO TB24
30.0000 mg | ORAL_TABLET | Freq: Every day | ORAL | Status: DC
  Administered 2021-12-23 – 2021-12-25 (×3): 30 mg via ORAL
  Filled 2021-12-23 (×3): qty 1

## 2021-12-23 MED ORDER — FUROSEMIDE 10 MG/ML IJ SOLN
40.0000 mg | Freq: Two times a day (BID) | INTRAMUSCULAR | Status: DC
  Administered 2021-12-24 – 2021-12-25 (×4): 40 mg via INTRAVENOUS
  Filled 2021-12-23 (×4): qty 4

## 2021-12-23 MED ORDER — HEPARIN BOLUS VIA INFUSION
1200.0000 [IU] | Freq: Once | INTRAVENOUS | Status: AC
  Administered 2021-12-23: 1200 [IU] via INTRAVENOUS
  Filled 2021-12-23: qty 1200

## 2021-12-23 MED ORDER — FUROSEMIDE 10 MG/ML IJ SOLN
80.0000 mg | Freq: Once | INTRAMUSCULAR | Status: AC
  Administered 2021-12-23: 80 mg via INTRAVENOUS
  Filled 2021-12-23: qty 8

## 2021-12-23 NOTE — Progress Notes (Signed)
Brief Cardiology note  Imp Chf Cm Angina SOB Cad S/p pci stent DM HTN Hx Smoking  Hyperlipidemia  . Plan Agree with admit to PC Heparin IV 24-48 hrs  F/u troponin/ Ekg Continue NTG/Imdur Agree Heart failure therapy  Continue Entresto/coreg/ Lasix IV 40 mg bid Consider functional ischemic evaluation with a myoview Asa/Prasagrel/b-blocker/ nitrates post pci  Defer cardia c cath

## 2021-12-23 NOTE — ED Notes (Signed)
Thomas Therapist, art in to speak with pt.

## 2021-12-23 NOTE — Progress Notes (Signed)
PROGRESS NOTE  Mark Brooks  DOB: Feb 27, 1966  PCP: Patient, No Pcp Per GGY:[id number]  DOA: 12/22/2021  LOS: 1 day  Hospital Day: 2  Brief narrative: Mark Brooks is a 56 y.o. male with PMH significant for DM1, HTN, HLD, three-vessel CAD, prior STEMI 2021 with a stent to RCA, CKD 3 was recently hospitalized to [facility] 9/5-9/8 for NSTEMI, underwent LHC with DES x2 to RCA with TTE showing LVEF of 40%, discharged on DAPT and multiple GDMT meds. 10/4-10/6, patient was hospitalized in this facility for CHF exacerbation which was believed to be related to excessive fluid intake, Echo with EF 35 to 40%, Entresto dose was increased at discharge.  10/13, patient was walking across a parking lot, started having chest pain and shortness of breath and lowered himself down to the ground.  He felt like his blood sugar was low.  Brought to the ED by EMS. In the ED, he was afebrile, hemodynamically stable, breathing on room air Labs showed CBC unremarkable, BMP with BUN/creatinine elevated to 27/2.34, BNP elevated to 2445, troponin elevated to 40, blood glucose level was 83 EKG shows sinus rhythm at 87 with T wave inversion in lateral leads Chest x-ray unremarkable Patient was started on heparin drip, nitroglycerin ointment, aspirin. Admitted to hospitalist service  Subjective: Patient was seen and examined this morning.  Pleasant middle-aged African-American male.  Sitting up at the edge of the bed.  He said he still has dull aching chest pain, pressure.  Wife at bedside. Patient is not happy about carb restricted/heart healthy diet.  He wants regular diet.  His wife however restricts him Chart reviewed Overnight, afebrile, heart rate in 80s, blood pressure in 130s, breathing on room air. Glucose level elevated 349 this morning.  Assessment and plan: Unstable angina  CAD, NSTEMI 11/14/21  With DES x2 to RCA, STEMI 2021 with stent to RCA,   Presented with chest pain, shortness of breath.  Most recent  stenting 5 weeks ago.  Reports compliance to DAPT (aspirin and Effient) Continue heparin infusion Continue aspirin, Effient, carvedilol, ezetimibe, rosuvastatin, Nitroglycerin sublingual as needed chest pain with morphine for breakthrough Cardiology consult called.  Chronic systolic CHF (congestive heart failure)  Ischemic cardiomyopathy, EF 35 to 40% 12/13/2021 Appears clinically euvolemic but BNP elevated to 2446.  Chest x-ray clear PTA on GDMT with carvedilol 12.5 mg daily, Lasix 40 mg twice daily, Entresto 97/103, eplerenone 25 mg daily, Imdur 30 mg, Continue all.  Uncontrolled type 1 diabetes mellitus with hyperglycemia A1c 11.2 on 12/14/2021.  PTA on 70/30 insulin 30 units twice daily Currently continued on the same but at 20 units twice daily. Blood sugar level running elevated up to 349 this morning. Increase it to 30 units twice daily. Recent Labs  Lab 12/22/21 2017 12/22/21 2115 12/22/21 2309 12/23/21 0150 12/23/21 0757  GLUCAP 130* 190* 173* 175* 349*   Stage 3b chronic kidney disease (CKD)  Renal function remains at baseline. Recent Labs    12/13/21 1036 12/14/21 0435 12/15/21 0448 12/22/21 1859  BUN 27* 28* 33* 27*  CREATININE 2.35* 2.25* 2.44* 2.34*   GERD Protonix  Goals of care   Code Status: Full Code    Mobility: Encourage ambulation  Skin assessment:     Nutritional status:  Body mass index is 28.19 kg/m.          Diet:  Diet Order             Diet heart healthy/carb modified Room service appropriate? Yes; Fluid consistency: Thin  Diet effective now                   DVT prophylaxis: Heparin drip    Antimicrobials: None Fluid: None  Consultants: Cardiology Family Communication: Wife at bedside  Status is: Inpatient  Continue in-hospital care because: On heparin drip Level of care: Progressive   Dispo: The patient is from: Home              Anticipated d/c is to: Home              Patient currently is not medically  stable to d/c.   Difficult to place patient No     Infusions:   heparin 1,050 Units/hr (12/23/21 4128)    Scheduled Meds:  aspirin EC  81 mg Oral Daily   carvedilol  12.5 mg Oral BID WC   ezetimibe  10 mg Oral Daily   furosemide  40 mg Oral BID   insulin aspart  0-15 Units Subcutaneous TID WC   insulin aspart  0-5 Units Subcutaneous QHS   insulin aspart protamine- aspart  30 Units Subcutaneous BID WC    morphine injection  4 mg Intravenous Once   pantoprazole  40 mg Oral Daily   prasugrel  10 mg Oral Daily   rosuvastatin  40 mg Oral Daily   sacubitril-valsartan  1 tablet Oral BID   spironolactone  25 mg Oral Daily   tamsulosin  0.4 mg Oral Daily    PRN meds: acetaminophen, nitroGLYCERIN, ondansetron (ZOFRAN) IV   Antimicrobials: Anti-infectives (From admission, onward)    None       Objective: Vitals:   12/23/21 0630 12/23/21 0803  BP: (!) 137/90 (!) 143/84  Pulse: 92 96  Resp: 20   Temp:    SpO2: 95%    No intake or output data in the 24 hours ending 12/23/21 1053 Filed Weights   12/22/21 1919  Weight: 81.6 kg   Weight change:  Body mass index is 28.19 kg/m.   Physical Exam: General exam: Pleasant, middle-aged African-American male. Skin: No rashes, lesions or ulcers. HEENT: Atraumatic, normocephalic, no obvious bleeding Lungs: Clear to auscultation bilaterally CVS: Regular rate and rhythm, no murmur.  No chest wall tenderness GI/Abd soft, nontender, nondistended, bowel sound present CNS: Alert, awake, oriented x3 Psychiatry: Mood appropriate Extremities: No pedal edema, no calf tenderness  Data Review: I have personally reviewed the laboratory data and studies available.  F/u labs ordered Unresulted Labs (From admission, onward)     Start     Ordered   12/24/21 0500  CBC with Differential/Platelet  Tomorrow morning,   R        12/23/21 1053   12/24/21 7867  Basic metabolic panel  Tomorrow morning,   R        12/23/21 1053             Signed, Terrilee Croak, MD [facility] 12/23/2021

## 2021-12-23 NOTE — Consult Note (Signed)
ANTICOAGULATION CONSULT NOTE - Initial Consult  Pharmacy Consult for Heparin infusion Indication: chest pain/ACS  Allergies  Allergen Reactions   Spironolactone Other (See Comments)    Gynecomastia seen Aug 2023    Patient Measurements: Height: 5\' 7"  (170.2 cm) Weight: 81.6 kg (180 lb) IBW/kg (Calculated) : 66.1 Heparin Dosing Weight: 81.6 kg  Vital Signs: Temp: 98 F (36.7 C) (10/14 1130) Temp Source: Oral (10/14 1130) BP: 130/86 (10/14 0900) Pulse Rate: 91 (10/14 0900)  Labs: Recent Labs    12/22/21 1859 12/22/21 2118 12/22/21 2204 12/23/21 0125 12/23/21 0415 12/23/21 1023  HGB 12.7*  --   --   --   --   --   HCT 40.2  --   --   --   --   --   PLT 255  --   --   --   --   --   APTT  --   --  24  --   --   --   LABPROT  --   --  15.1  --   --   --   INR  --   --  1.2  --   --   --   HEPARINUNFRC  --   --   --  0.49 0.24* 0.11*  CREATININE 2.34*  --   --   --   --   --   TROPONINIHS 40* 34*  --   --   --  28*     Estimated Creatinine Clearance: 35.6 mL/min (A) (by C-G formula based on SCr of 2.34 mg/dL (H)).   Medical History: Past Medical History:  Diagnosis Date   Coronary artery disease    Diabetes mellitus without complication (HCC)    High cholesterol    Hypertension    Myocardial infarct (HCC)   Heparin Dosing Weight: 81.6 kg  Medications:  (Not in a hospital admission)  Assessment: Patient admitted with chest pain. Pharmacy consulted to manage heparin infusion for ACS. No hx of AC prior to admission noted.   Baseline aPTT and INR ordered per protocol. Baseline CBC appropriate to initiate heparin. Scr 2.34  Date Time aPTT/HL Rate/Comment 10/14 0415 0.24  Subthera; 900 > 1050 un/hr  10/14 1023 0.11  Subthera; 1050 > repeated to confirm 10/14 1211 0.13  Subthera; 1050 > 1350 un/hr     Goal of Therapy:  Heparin level 0.3-0.7 units/ml Monitor platelets by anticoagulation protocol: Yes   Plan:  10/14:  HL @ 1211 = 0.13, SUBtherapeutic   Will order heparin 2400 units IV x1 bolus and increase drip rate to 1350 units/hr.  Will recheck HL 6 hrs after rate change; then daily monitoring once consecutively therapeutic. CTM daily CBC while on heparin infusion  Lorna Dibble 12/23/2021 12:19 PM

## 2021-12-23 NOTE — ED Notes (Addendum)
Pt upset, yelling states he has not had his nighttime medications or insulin. Pt is complaining about emergency department staff and states has not seen his nurse this evening. Pt informed I am the charge rn and am here now to check on him. Pt states he has not had his insulin or his nighttime medications and was told by ed secretary that he refused his insulin earlier. Informed pt at this time that it may have been marked refused because he did not need the insulin due to previous sugar, but there was no free text note incomputer to confirm. Informed pt that I am sorry his nurse has not been in to see him, she has been with a critical patient this shift, informed pt "let me check you sugar now and give you your entresto, what would \\you  like to drink with it". Pt continues to state he is a "critical" patient because he is "a heart patient". Pt informed that "I am doing the best I can right now, I'm here, let's check your sugar and dose your insulin"  Pt is verbally abusive to staff at this time. After checking pt's blood sugar is greater than 400, per orders call md and obtain stat order for glucose, brenda morrison, np notified of 460 fsbs and order for stat glucose received. Serum glucose obtained, sent to lab. Pt continues to express discourse with staff in emergency department and at this time is asking to speak with this Production assistant, radio. [place] supervisor notified that pt would like to speak with him.

## 2021-12-23 NOTE — ED Notes (Signed)
Pt ambulatory to go to the bathroom.

## 2021-12-23 NOTE — Consult Note (Signed)
CARDIOLOGY CONSULT NOTE               Patient ID: Mark Brooks MRN: [id number] DOB/AGE: Aug 06, 1965 56 y.o.  Admit date: 12/22/2021 Referring Physician Lindajo Royal, MD Primary Physician  Primary Cardiologist Dr. Geralyn Flash Duke Reason for Consultation shortness of breath chest pain known coronary artery disease recent PCI and stent  HPI: Patient is a 56 year old male history of diabetes type 1 coronary artery disease multivessel history of PCI and stent prior STEMI 2021 recent PCI and stent September 2023 at [facility] DES to RCA ischemic cardiomyopathy EF around 40% history of congestive heart failure generalized weakness and fatigue lower extremity edema was discharged home about a month ago after having unstable angina possible mild non-STEMI.  Patient was discharged on prasugrel and aspirin beta-blockers Entresto statin.  Last 1 to 2 days while having intimacy with his wife he started having shortness of breath dyspnea so his wife urged him to come to the emergency room for further evaluation he also describes some chest tightness and heaviness patient is pain-free now no shortness of breath with some leg edema.  Patient has significant chronic renal sufficiency BNP of about 2500 chest x-ray was unremarkable EKG unremarkable patient was admitted and cardiology consultation was not recommended  Review of systems complete and found to be negative unless listed above     Past Medical History:  Diagnosis Date   Coronary artery disease    Diabetes mellitus without complication (HCC)    High cholesterol    Hypertension    Myocardial infarct [facility])     Past Surgical History:  Procedure Laterality Date   PERCUTANEOUS CORONARY STENT INTERVENTION (PCI-S)      (Not in a hospital admission)  Social History   Socioeconomic History   Marital status: Divorced    Spouse name: Not on file   Number of children: Not on file   Years of education: Not on file   Highest education level: Not  on file  Occupational History   Not on file  Tobacco Use   Smoking status: Some Days    Types: Cigarettes   Smokeless tobacco: Never  Vaping Use   Vaping Use: Never used  Substance and Sexual Activity   Alcohol use: Not Currently   Drug use: Never   Sexual activity: Not on file  Other Topics Concern   Not on file  Social History Narrative   Not on file   Social Determinants of Health   Financial Resource Strain: Not on file  Food Insecurity: No Food Insecurity (12/13/2021)   Hunger Vital Sign    Worried About Running Out of Food in the Last Year: Never true    Ran Out of Food in the Last Year: Never true  Transportation Needs: No Transportation Needs (12/13/2021)   PRAPARE - Administrator, Civil Service (Medical): No    Lack of Transportation (Non-Medical): No  Physical Activity: Not on file  Stress: Not on file  Social Connections: Not on file  Intimate Partner Violence: Not At Risk (12/13/2021)   Humiliation, Afraid, Rape, and Kick questionnaire    Fear of Current or Ex-Partner: No    Emotionally Abused: No    Physically Abused: No    Sexually Abused: No    Family History  Problem Relation Age of Onset   Diabetes Mellitus II Mother    Heart disease Father       Review of systems complete and found to be negative unless listed  above      PHYSICAL EXAM  General: Well developed, well nourished, in no acute distress HEENT:  Normocephalic and atramatic Neck:  No JVD.  Lungs: Clear bilaterally to auscultation and percussion. Heart: HRRR . Normal S1 and S2 without gallops or murmurs.  Abdomen: Bowel sounds are positive, abdomen soft and non-tender  Msk:  Back normal, normal gait. Normal strength and tone for age. Extremities: No clubbing, cyanosis or 1+edema.   Neuro: Alert and oriented X 3. Psych:  Good affect, responds appropriately  Labs:   Lab Results  Component Value Date   WBC 5.5 12/22/2021   HGB 12.7 (L) 12/22/2021   HCT 40.2  12/22/2021   MCV 88.7 12/22/2021   PLT 255 12/22/2021    Recent Labs  Lab 12/22/21 1859  NA 140  K 3.9  CL 105  CO2 27  BUN 27*  CREATININE 2.34*  CALCIUM 8.5*  PROT 6.7  BILITOT 0.7  ALKPHOS 50  ALT 13  AST 22  GLUCOSE 83   No results found for: "CKTOTAL", "CKMB", "CKMBINDEX", "TROPONINI"  Lab Results  Component Value Date   CHOL 248 (H) 03/27/2020   Lab Results  Component Value Date   HDL 36 (L) 03/27/2020   Lab Results  Component Value Date   LDLCALC 199 (H) 03/27/2020   Lab Results  Component Value Date   TRIG 65 03/27/2020   Lab Results  Component Value Date   CHOLHDL 6.9 03/27/2020   No results found for: "LDLDIRECT"    Radiology: DG Chest 2 View  Result Date: 12/22/2021 CLINICAL DATA:  Chest pain and shortness of breath. EXAM: CHEST - 2 VIEW COMPARISON:  December 14, 2021 FINDINGS: The heart size and mediastinal contours are within normal limits. There is no evidence of acute infiltrate, pleural effusion or pneumothorax. The visualized skeletal structures are unremarkable. IMPRESSION: No active cardiopulmonary disease. Electronically Signed   By: Aram Candelahaddeus  Houston M.D.   On: 12/22/2021 19:18   ECHOCARDIOGRAM COMPLETE  Result Date: 12/14/2021    ECHOCARDIOGRAM REPORT   Patient Name:   Mark Brooks Date of Exam: 12/13/2021 Medical Rec #:  [id number]     Height:       67.0 in Accession #:    [id number]    Weight:       185.0 lb Date of Birth:  04/01/1965     BSA:          1.956 m Patient Age:    55 years      BP:           153/96 mmHg Patient Gender: M             HR:           92 bpm. Exam Location:  [facility] Procedure: 2D Echo, Color Doppler, Cardiac Doppler and Intracardiac            Opacification Agent Indications:     R06.00 Dyspnea; Elevated Troponin  History:         Patient has prior history of Echocardiogram examinations, most                  recent 03/27/2020. HFrEF, CAD and Previous Myocardial                  Infarction, CKD stage 3b; Risk  Factors:Hypertension, HCL and                  Diabetes.  Sonographer:     Humphrey RollsJoan Heiss  Referring Phys:  [id number] AMY N COX Diagnosing Phys: Marcina Millard MD IMPRESSIONS  1. Left ventricular ejection fraction, by estimation, is 35 to 40%. The left ventricle has moderately decreased function. The left ventricle demonstrates global hypokinesis. Left ventricular diastolic parameters are indeterminate.  2. Right ventricular systolic function is normal. The right ventricular size is normal.  3. The mitral valve is normal in structure. Mild mitral valve regurgitation. No evidence of mitral stenosis.  4. The aortic valve is normal in structure. Aortic valve regurgitation is not visualized. No aortic stenosis is present.  5. The inferior vena cava is normal in size with greater than 50% respiratory variability, suggesting right atrial pressure of 3 mmHg. FINDINGS  Left Ventricle: Left ventricular ejection fraction, by estimation, is 35 to 40%. The left ventricle has moderately decreased function. The left ventricle demonstrates global hypokinesis. Definity contrast agent was given IV to delineate the left ventricular endocardial borders. The left ventricular internal cavity size was normal in size. There is no left ventricular hypertrophy. Left ventricular diastolic parameters are indeterminate. Right Ventricle: The right ventricular size is normal. No increase in right ventricular wall thickness. Right ventricular systolic function is normal. Left Atrium: Left atrial size was normal in size. Right Atrium: Right atrial size was normal in size. Pericardium: There is no evidence of pericardial effusion. Mitral Valve: The mitral valve is normal in structure. Mild mitral valve regurgitation. No evidence of mitral valve stenosis. Tricuspid Valve: The tricuspid valve is normal in structure. Tricuspid valve regurgitation is mild . No evidence of tricuspid stenosis. Aortic Valve: The aortic valve is normal in structure. Aortic  valve regurgitation is not visualized. No aortic stenosis is present. Aortic valve mean gradient measures 2.0 mmHg. Aortic valve peak gradient measures 3.7 mmHg. Aortic valve area, by VTI measures 3.05 cm. Pulmonic Valve: The pulmonic valve was normal in structure. Pulmonic valve regurgitation is not visualized. No evidence of pulmonic stenosis. Aorta: The aortic root is normal in size and structure. Venous: The inferior vena cava is normal in size with greater than 50% respiratory variability, suggesting right atrial pressure of 3 mmHg. IAS/Shunts: No atrial level shunt detected by color flow Doppler.  LEFT VENTRICLE PLAX 2D LVIDd:         5.50 cm      Diastology LVIDs:         4.60 cm      LV e' medial:    7.72 cm/s LV PW:         1.20 cm      LV E/e' medial:  12.7 LV IVS:        1.10 cm      LV e' lateral:   6.64 cm/s LVOT diam:     2.10 cm      LV E/e' lateral: 14.8 LV SV:         52 LV SV Index:   26 LVOT Area:     3.46 cm  LV Volumes (MOD) LV vol d, MOD A2C: 178.0 ml LV vol d, MOD A4C: 180.0 ml LV vol s, MOD A2C: 114.0 ml LV vol s, MOD A4C: 115.0 ml LV SV MOD A2C:     64.0 ml LV SV MOD A4C:     180.0 ml LV SV MOD BP:      64.8 ml RIGHT VENTRICLE RV Basal diam:  3.50 cm TAPSE (M-mode): 2.0 cm LEFT ATRIUM             Index  RIGHT ATRIUM           Index LA diam:        3.90 cm 1.99 cm/m   RA Area:     14.40 cm LA Vol (A2C):   51.4 ml 26.27 ml/m  RA Volume:   34.30 ml  17.53 ml/m LA Vol (A4C):   46.6 ml 23.82 ml/m LA Biplane Vol: 53.1 ml 27.14 ml/m  AORTIC VALVE                    PULMONIC VALVE AV Area (Vmax):    2.98 cm     PV Vmax:       0.61 m/s AV Area (Vmean):   2.88 cm     PV Peak grad:  1.5 mmHg AV Area (VTI):     3.05 cm AV Vmax:           95.80 cm/s AV Vmean:          66.700 cm/s AV VTI:            0.169 m AV Peak Grad:      3.7 mmHg AV Mean Grad:      2.0 mmHg LVOT Vmax:         82.50 cm/s LVOT Vmean:        55.400 cm/s LVOT VTI:          0.149 m LVOT/AV VTI ratio: 0.88  AORTA Ao Root  diam: 3.70 cm MITRAL VALVE MV Area (PHT): 5.08 cm    SHUNTS MV Decel Time: 149 msec    Systemic VTI:  0.15 m MV E velocity: 98.40 cm/s  Systemic Diam: 2.10 cm Marcina Millard MD Electronically signed by Marcina Millard MD Signature Date/Time: 12/14/2021/1:38:18 PM    Final    DG Chest 2 View  Result Date: 12/14/2021 CLINICAL DATA:  [id number] dyspnea on exertion. EXAM: CHEST - 2 VIEW COMPARISON:  PA Lat yesterday at 11:01 a.m. FINDINGS: PA Lat at 6:32 a.m. The heart silhouette has decreased to normal size on today's exam. No vascular congestion is seen or findings of acute edema. There is aortic tortuosity and atherosclerosis with otherwise unremarkable mediastinal configuration. The lungs are clear. The sulci are sharp. There is thoracic spondylosis. IMPRESSION: No acute radiographic chest findings. The heart silhouette was previously enlarged and now is normal in size. Electronically Signed   By: Almira Bar M.D.   On: 12/14/2021 06:51   DG Chest 2 View  Addendum Date: 12/13/2021   ADDENDUM REPORT: 12/13/2021 15:57 ADDENDUM: Imaging is compared to imaging from 2021, a double density along the RIGHT heart border is a new finding and may be related to worsening heart failure and LEFT atrial enlargement. Echocardiographic correlation and follow-up PA radiograph is suggested after resolution of symptoms. Electronically Signed   By: Donzetta Kohut M.D.   On: 12/13/2021 15:57   Result Date: 12/13/2021 CLINICAL DATA:  56 year old male presenting with shortness of breath and chest pain. EXAM: CHEST - 2 VIEW COMPARISON:  Report from March of 2021. FINDINGS: Mild to moderate cardiomegaly. Fullness of hilar structures suggest pulmonary vascular engorgement. There is a double density along the RIGHT heart border a can be seen in the setting of LEFT atrial enlargement, no splaying of the carina. Perhaps mild enlargement seen on the lateral projection. Mild fissural thickening. No signs of consolidation.  Subtle interstitial prominence at the lung bases and streaky opacities. No pneumothorax. On limited assessment there is no acute skeletal process. IMPRESSION: 1. Mild  to moderate cardiomegaly with pulmonary vascular engorgement. 2. Subtle interstitial prominence at the lung bases and streaky opacities. Constellation of findings raising the question of mild heart failure or volume overload. A would also correlate with any signs of atypical infection 3. Double density along the RIGHT heart border a can be seen in the setting of LEFT atrial enlargement. Seen without splaying of the carina and perhaps mild prominence of the LEFT atrium on lateral projection. Follow-up PA chest is suggested after resolution of symptoms along with echocardiography as warranted. Also comparison with prior imaging could be helpful to assess chronicity, prior imaging not available at this time and if made available an addendum can be provided. Electronically Signed: By: Zetta Bills M.D. On: 12/13/2021 11:15    EKG: Normal sinus rhythm nonspecific ST-T wave changes rate of 90  ASSESSMENT AND PLAN:  Shortness of breath Coronary artery disease Possible unstable angina Recent PCI and stent Cardiomyopathy Chronic renal insufficiency stage III Non-STEMI 2021 Smoking Hyperlipidemia Diabetes . Plan Agree with admit rule out myocardial infarction Follow-up EKGs troponins Recommend heparin therapy 24 to 48 hours Echocardiogram for assessment of left ventricular function wall motion Consider functional study versus cardiac cath for angina type symptoms Recommend heart failure therapy IV diuretics Maintain Entresto beta-blocker Eplerenose aspirin Agree with continued diabetes management with insulin consider adding Farxiga Continue statin therapy with Crestor Zetia Defer cardiac cath for now  Signed: Yolonda Kida MD, 12/23/2021, 2:37 PM

## 2021-12-23 NOTE — ED Notes (Signed)
Pt ate breakfast. He took all morning meds and tolerated well. Pt got labs drawn. IV pulled well. MD came in and talked to pt. Pt sitting on bed with family at bedside.

## 2021-12-23 NOTE — Consult Note (Signed)
ANTICOAGULATION CONSULT NOTE - Initial Consult  Pharmacy Consult for Heparin infusion Indication: chest pain/ACS  Allergies  Allergen Reactions   Spironolactone Other (See Comments)    Gynecomastia seen Aug 2023    Patient Measurements: Height: 5\' 7"  (170.2 cm) Weight: 81.6 kg (180 lb) IBW/kg (Calculated) : 66.1 Heparin Dosing Weight: 81.6 kg  Vital Signs: Temp: 98.4 F (36.9 C) (10/14 1837) Temp Source: Oral (10/14 1837) BP: 125/84 (10/14 1855) Pulse Rate: 86 (10/14 1855)  Labs: Recent Labs    12/22/21 1859 12/22/21 2118 12/22/21 2204 12/23/21 0125 12/23/21 1023 12/23/21 1211 12/23/21 1857  HGB 12.7*  --   --   --   --   --   --   HCT 40.2  --   --   --   --   --   --   PLT 255  --   --   --   --   --   --   APTT  --   --  24  --   --   --   --   LABPROT  --   --  15.1  --   --   --   --   INR  --   --  1.2  --   --   --   --   HEPARINUNFRC  --   --   --    < > 0.11* 0.13* 0.41  CREATININE 2.34*  --   --   --   --   --   --   TROPONINIHS 40* 34*  --   --  28*  --   --    < > = values in this interval not displayed.     Estimated Creatinine Clearance: 35.6 mL/min (A) (by C-G formula based on SCr of 2.34 mg/dL (H)).   Medical History: Past Medical History:  Diagnosis Date   Coronary artery disease    Diabetes mellitus without complication (HCC)    High cholesterol    Hypertension    Myocardial infarct (HCC)   Heparin Dosing Weight: 81.6 kg  Medications:  (Not in a hospital admission)  Assessment: Patient admitted with chest pain. Pharmacy consulted to manage heparin infusion for ACS. No hx of AC prior to admission noted.   Baseline aPTT and INR ordered per protocol. Baseline CBC appropriate to initiate heparin. Scr 2.34  Date Time aPTT/HL Rate/Comment 10/14 0415 0.24  Subthera; 900 > 1050 un/hr  10/14 1023 0.11  Subthera; 1050 > repeated to confirm 10/14 1211 0.13  Subthera; 1050 > 1350 un/hr  10/14 1857 0.41  Therapeutic x 1 @ 1350 un/hr  Goal  of Therapy:  Heparin level 0.3-0.7 units/ml Monitor platelets by anticoagulation protocol: Yes   Plan:   Continue heparin infusion at 1350 units/hr.  Repeat HL in 6hrs to verify therapeutic rate, then daily monitoring once consecutively therapeutic. CTM daily CBC while on heparin infusion  Liem Copenhaver Rodriguez-Guzman PharmD, BCPS 12/23/2021 7:14 PM

## 2021-12-23 NOTE — Consult Note (Signed)
ANTICOAGULATION CONSULT NOTE - Initial Consult  Pharmacy Consult for Heparin infusion Indication: chest pain/ACS  Allergies  Allergen Reactions   Spironolactone Other (See Comments)    Gynecomastia seen Aug 2023    Patient Measurements: Height: 5\' 7"  (170.2 cm) Weight: 81.6 kg (180 lb) IBW/kg (Calculated) : 66.1 Heparin Dosing Weight: 81.6 kg  Vital Signs: Temp: 98.1 F (36.7 C) (10/13 1900) Temp Source: Oral (10/13 1900) BP: 132/96 (10/14 0530) Pulse Rate: 88 (10/14 0530)  Labs: Recent Labs    12/22/21 1859 12/22/21 2118 12/22/21 2204 12/23/21 0125 12/23/21 0415  HGB 12.7*  --   --   --   --   HCT 40.2  --   --   --   --   PLT 255  --   --   --   --   APTT  --   --  24  --   --   LABPROT  --   --  15.1  --   --   INR  --   --  1.2  --   --   HEPARINUNFRC  --   --   --  0.49 0.24*  CREATININE 2.34*  --   --   --   --   TROPONINIHS 40* 34*  --   --   --      Estimated Creatinine Clearance: 35.6 mL/min (A) (by C-G formula based on SCr of 2.34 mg/dL (H)).   Medical History: Past Medical History:  Diagnosis Date   Coronary artery disease    Diabetes mellitus without complication (HCC)    High cholesterol    Hypertension    Myocardial infarct [facility])     Medications:  (Not in a hospital admission)  Assessment: Patient admitted with chest pain. Pharmacy consulted to manage heparin infusion for ACS. No hx of AC prior to admission noted.   Baseline aPTT and INR ordered per protocol. Baseline CBC appropriate to initiate heparin. Scr 2.34  Goal of Therapy:  Heparin level 0.3-0.7 units/ml Monitor platelets by anticoagulation protocol: Yes   Plan:  10/14:  HL @ 0415 = 0.24, SUBtherapeutic  Will order heparin 1200 units IV X 1 bolus and increase drip rate to 1050 units/hr.  Will recheck HL 6 hrs after rate change.   Kaiana Marion D 12/23/2021 6:19 AM

## 2021-12-24 ENCOUNTER — Other Ambulatory Visit: Payer: Self-pay

## 2021-12-24 DIAGNOSIS — I200 Unstable angina: Secondary | ICD-10-CM | POA: Diagnosis not present

## 2021-12-24 LAB — CBG MONITORING, ED
Glucose-Capillary: 334 mg/dL — ABNORMAL HIGH (ref 70–99)
Glucose-Capillary: 269 mg/dL — ABNORMAL HIGH (ref 70–99)
Glucose-Capillary: 262 mg/dL — ABNORMAL HIGH (ref 70–99)

## 2021-12-24 LAB — CBC WITH DIFFERENTIAL/PLATELET
Abs Immature Granulocytes: 0.03 10*3/uL (ref 0.00–0.07)
Basophils Absolute: 0 10*3/uL (ref 0.0–0.1)
Basophils Relative: 1 %
Eosinophils Absolute: 0.3 10*3/uL (ref 0.0–0.5)
Eosinophils Relative: 4 %
HCT: 37.1 % — ABNORMAL LOW (ref 39.0–52.0)
Hemoglobin: 11.8 g/dL — ABNORMAL LOW (ref 13.0–17.0)
Immature Granulocytes: 1 %
Lymphocytes Relative: 25 %
Lymphs Abs: 1.6 10*3/uL (ref 0.7–4.0)
MCH: 27.6 pg (ref 26.0–34.0)
MCHC: 31.8 g/dL (ref 30.0–36.0)
MCV: 86.9 fL (ref 80.0–100.0)
Monocytes Absolute: 0.4 10*3/uL (ref 0.1–1.0)
Monocytes Relative: 6 %
Neutro Abs: 4.1 10*3/uL (ref 1.7–7.7)
Neutrophils Relative %: 63 %
Platelets: 221 10*3/uL (ref 150–400)
RBC: 4.27 MIL/uL (ref 4.22–5.81)
RDW: 12.3 % (ref 11.5–15.5)
WBC: 6.4 10*3/uL (ref 4.0–10.5)
nRBC: 0 % (ref 0.0–0.2)

## 2021-12-24 LAB — BASIC METABOLIC PANEL
Anion gap: 8 (ref 5–15)
BUN: 27 mg/dL — ABNORMAL HIGH (ref 6–20)
CO2: 22 mmol/L (ref 22–32)
Calcium: 8.5 mg/dL — ABNORMAL LOW (ref 8.9–10.3)
Chloride: 105 mmol/L (ref 98–111)
Creatinine, Ser: 2.51 mg/dL — ABNORMAL HIGH (ref 0.61–1.24)
GFR, Estimated: 29 mL/min — ABNORMAL LOW (ref >60)
Glucose, Bld: 415 mg/dL — ABNORMAL HIGH (ref 70–99)
Potassium: 4 mmol/L (ref 3.5–5.1)
Sodium: 135 mmol/L (ref 135–145)

## 2021-12-24 LAB — GLUCOSE, RANDOM: Glucose, Bld: 471 mg/dL — ABNORMAL HIGH (ref 70–99)

## 2021-12-24 LAB — HEPARIN LEVEL (UNFRACTIONATED): Heparin Unfractionated: 0.59 IU/mL (ref 0.30–0.70)

## 2021-12-24 LAB — GLUCOSE, CAPILLARY: Glucose-Capillary: 461 mg/dL — ABNORMAL HIGH (ref 70–99)

## 2021-12-24 MED ORDER — INSULIN ASPART 100 UNIT/ML IJ SOLN
20.0000 [IU] | Freq: Once | INTRAMUSCULAR | Status: AC
  Administered 2021-12-24: 20 [IU] via SUBCUTANEOUS
  Filled 2021-12-24: qty 1

## 2021-12-24 MED ORDER — INSULIN ASPART 100 UNIT/ML IJ SOLN
5.0000 [IU] | Freq: Once | INTRAMUSCULAR | Status: AC
  Administered 2021-12-24: 5 [IU] via SUBCUTANEOUS
  Filled 2021-12-24: qty 1

## 2021-12-24 NOTE — Progress Notes (Signed)
[facility]    SUBJECTIVE: Patient feeling reasonably well denies any significant chest pain just has a dull ache in the center of his chest shortness of breath no palpitations tachycardia resting comfortably   Vitals:   12/24/21 0532 12/24/21 0808 12/24/21 0830 12/24/21 1000  BP: (!) 143/99 126/72 (!) 147/94 113/78  Pulse: 94 97 92 87  Resp: 20  16 18   Temp: 98.6 F (37 C)     TempSrc:      SpO2: 100%  97% 98%  Weight:      Height:         Intake/Output Summary (Last 24 hours) at 12/24/2021 1012 Last data filed at 12/23/2021 1706 Gross per 24 hour  Intake --  Output 2200 ml  Net -2200 ml      PHYSICAL EXAM  General: Well developed, well nourished, in no acute distress HEENT:  Normocephalic and atramatic Neck:  No JVD.  Lungs: Clear bilaterally to auscultation and percussion. Heart: HRRR . Normal S1 and S2 without gallops or murmurs.  Abdomen: Bowel sounds are positive, abdomen soft and non-tender  Msk:  Back normal, normal gait. Normal strength and tone for age. Extremities: No clubbing, cyanosis or edema.   Neuro: Alert and oriented X 3. Psych:  Good affect, responds appropriately   LABS: Basic Metabolic Panel: Recent Labs    12/22/21 1859 12/24/21 0239 12/24/21 2333  NA 140 135  --   K 3.9 4.0  --   CL 105 105  --   CO2 27 22  --   GLUCOSE 83 415* 471*  BUN 27* 27*  --   CREATININE 2.34* 2.51*  --   CALCIUM 8.5* 8.5*  --    Liver Function Tests: Recent Labs    12/22/21 1859  AST 22  ALT 13  ALKPHOS 50  BILITOT 0.7  PROT 6.7  ALBUMIN 3.0*   No results for input(s): "LIPASE", "AMYLASE" in the last 72 hours. CBC: Recent Labs    12/22/21 1859 12/24/21 0239  WBC 5.5 6.4  NEUTROABS 3.3 4.1  HGB 12.7* 11.8*  HCT 40.2 37.1*  MCV 88.7 86.9  PLT 255 221   Cardiac Enzymes: No results for input(s): "CKTOTAL", "CKMB", "CKMBINDEX", "TROPONINI" in the last 72 hours. BNP: Invalid input(s): "POCBNP" D-Dimer: No results for input(s):  "DDIMER" in the last 72 hours. Hemoglobin A1C: No results for input(s): "HGBA1C" in the last 72 hours. Fasting Lipid Panel: No results for input(s): "CHOL", "HDL", "LDLCALC", "TRIG", "CHOLHDL", "LDLDIRECT" in the last 72 hours. Thyroid Function Tests: No results for input(s): "TSH", "T4TOTAL", "T3FREE", "THYROIDAB" in the last 72 hours.  Invalid input(s): "FREET3" Anemia Panel: No results for input(s): "VITAMINB12", "FOLATE", "FERRITIN", "TIBC", "IRON", "RETICCTPCT" in the last 72 hours.  DG Chest 2 View  Result Date: 12/22/2021 CLINICAL DATA:  Chest pain and shortness of breath. EXAM: CHEST - 2 VIEW COMPARISON:  December 14, 2021 FINDINGS: The heart size and mediastinal contours are within normal limits. There is no evidence of acute infiltrate, pleural effusion or pneumothorax. The visualized skeletal structures are unremarkable. IMPRESSION: No active cardiopulmonary disease. Electronically Signed   By: Virgina Norfolk M.D.   On: 12/22/2021 19:18     Echo echo with mild/mod  reduced left ventricular function and around 35 to 40%  TELEMETRY: Normal sinus rhythm nonspecific ST changes rate of 70:  ASSESSMENT AND PLAN:  Principal Problem:   Unstable angina (HCC) Active Problems:   CAD S/P percutaneous coronary angioplasty   Chronic systolic CHF (congestive  heart failure) (HCC)   Stage 3b chronic kidney disease (CKD) (HCC)   Type 1 diabetes mellitus ([place]) Multivessel coronary disease Mild cardiomyopathy Congestive heart failure   Plan Agree with admit to telemetry follow-up EKGs troponins Agree with anticoagulation heparin 24 to 48 hours Renal insufficiency should be followed with modest gentle hydration We will defer cardiac cath for now in favor of a functional study Continue heart failure therapy Entresto beta-blocker diuretics Agree with diabetes management control And therapy for hyperlipidemia Recommend Myoview tomorrow for assessment of ischemia   Yolonda Kida, MD,  12/24/2021 10:12 AM

## 2021-12-24 NOTE — Progress Notes (Signed)
PROGRESS NOTE  Mark Brooks  DOB: 03/30/1965  PCP: Patient, No Pcp Per UQJ:[id number]  DOA: 12/22/2021  LOS: 2 days  Hospital Day: 3  Brief narrative: Mark Brooks is a 56 y.o. male with PMH significant for DM1, HTN, HLD, three-vessel CAD, prior STEMI 2021 with a stent to RCA, CKD 3 was recently hospitalized to [facility] 9/5-9/8 for NSTEMI, underwent LHC with DES x2 to RCA with TTE showing LVEF of 40%, discharged on DAPT and multiple GDMT meds. 10/4-10/6, patient was hospitalized in this facility for CHF exacerbation which was believed to be related to excessive fluid intake, Echo with EF 35 to 40%, Entresto dose was increased at discharge.  10/13, patient was walking across a parking lot, started having chest pain and shortness of breath and lowered himself down to the ground.  He felt like his blood sugar was low.  Brought to the ED by EMS. In the ED, he was afebrile, hemodynamically stable, breathing on room air Labs showed CBC unremarkable, BMP with BUN/creatinine elevated to 27/2.34, BNP elevated to 2445, troponin elevated to 40, blood glucose level was 83 EKG shows sinus rhythm at 87 with T wave inversion in lateral leads Chest x-ray unremarkable Patient was started on heparin drip, nitroglycerin ointment, aspirin. Admitted to hospitalist service  Subjective: Patient was seen and examined this morning.   Lying on bed.  Not in distress.  But he still complains of heaviness in the chest.   Family not at bedside today.    Assessment and plan: Acute on chronic systolic CHF  Ischemic cardiomyopathy, EF 35 to 40% 12/13/2021 Presented with chest heaviness, BNP elevated to 2446.  Chest x-ray clear Per my discussion with cardiologist Dr. Juliann Pares, he suspects CHF exacerbation as the cause of his symptoms. Give a dose of IV Lasix 80 mg yesterday.  Currently on Lasix 40 mg IV twice daily. PTA on GDMT with carvedilol 12.5 mg daily, Lasix 40 mg twice daily, Entresto 97/103, eplerenone 25 mg  daily, Imdur 30 mg Continue all per cardiology recommendation. Net IO Since Admission: -2,200 mL [12/24/21 1021] Continue to monitor for daily intake output, weight, blood pressure, BNP, renal function and electrolytes. Recent Labs  Lab 12/22/21 1859 12/22/21 1900 12/24/21 0239  BNP  --  2,444.6*  --   BUN 27*  --  27*  CREATININE 2.34*  --  2.51*  K 3.9  --  4.0   Unstable angina  CAD, NSTEMI 11/14/21  With DES x2 to RCA, STEMI 2021 with stent to RCA,   Presented with chest pain, shortness of breath.  Most recent stenting 5 weeks ago.  Reports compliance to DAPT (aspirin and Effient) Troponin trend as below, likely elevated because of LV strain. Currently on heparin infusion.  Cardiology following. Continue aspirin, Effient, carvedilol, ezetimibe, rosuvastatin, Nitroglycerin sublingual as needed chest pain with morphine for breakthrough Recent Labs    12/22/21 1859 12/22/21 2118 12/23/21 1023  TROPONINIHS 40* 34* 28*   Uncontrolled type 1 diabetes mellitus with hyperglycemia A1c 11.2 on 12/14/2021.  PTA on 70/30 insulin 30 units twice daily Currently continued on the same.  Blood sugar level running brittle and was over 460 last night.  Apparently that was because patient refused evening dose of insulin.  Recommend compliance. Recommend carb restricted diet although patient is not happy about it. Recent Labs  Lab 12/23/21 0757 12/23/21 1147 12/23/21 1655 12/23/21 2323 12/24/21 0752  GLUCAP 349* 297* 102* 460* 334*  AKI on CKD 3B Renal function baseline less than 2.5.  Seems slightly up today with diuresis.  Continue to monitor. Patient reports he has an upcoming first appointment with nephrology in 1 to 2 weeks. Recent Labs    12/13/21 1036 12/14/21 0435 12/15/21 0448 12/22/21 1859 12/24/21 0239  BUN 27* 28* 33* 27* 27*  CREATININE 2.35* 2.25* 2.44* 2.34* 2.51*   GERD Protonix  Goals of care   Code Status: Full Code    Mobility: Encourage ambulation  Skin  assessment:     Nutritional status:  Body mass index is 28.19 kg/m.          Diet:  Diet Order             Diet heart healthy/carb modified Room service appropriate? Yes; Fluid consistency: Thin  Diet effective now                   DVT prophylaxis: Heparin drip    Antimicrobials: None Fluid: None  Consultants: Cardiology Family Communication: Wife not at bedside  Status is: Inpatient  Continue in-hospital care because: On heparin drip Level of care: Progressive   Dispo: The patient is from: Home              Anticipated d/c is to: Home when cleared by cardiology              Patient currently is not medically stable to d/c.   Difficult to place patient No     Infusions:   heparin 1,350 Units/hr (12/24/21 0926)    Scheduled Meds:  aspirin EC  81 mg Oral Daily   carvedilol  12.5 mg Oral BID WC   ezetimibe  10 mg Oral Daily   furosemide  40 mg Intravenous BID   insulin aspart  0-15 Units Subcutaneous TID WC   insulin aspart  0-5 Units Subcutaneous QHS   insulin aspart protamine- aspart  30 Units Subcutaneous BID WC   isosorbide mononitrate  30 mg Oral Daily    morphine injection  4 mg Intravenous Once   pantoprazole  40 mg Oral Daily   prasugrel  10 mg Oral Daily   rosuvastatin  40 mg Oral Daily   sacubitril-valsartan  1 tablet Oral BID   spironolactone  25 mg Oral Daily   tamsulosin  0.4 mg Oral Daily    PRN meds: acetaminophen, nitroGLYCERIN, ondansetron (ZOFRAN) IV   Antimicrobials: Anti-infectives (From admission, onward)    None       Objective: Vitals:   12/24/21 0830 12/24/21 1000  BP: (!) 147/94 113/78  Pulse: 92 87  Resp: 16 18  Temp:    SpO2: 97% 98%    Intake/Output Summary (Last 24 hours) at 12/24/2021 1021 Last data filed at 12/23/2021 1706 Gross per 24 hour  Intake --  Output 2200 ml  Net -2200 ml   Filed Weights   12/22/21 1919  Weight: 81.6 kg   Weight change:  Body mass index is 28.19 kg/m.    Physical Exam: General exam: Pleasant, middle-aged African-American male.  Not in physical distress Skin: No rashes, lesions or ulcers. HEENT: Atraumatic, normocephalic, no obvious bleeding Lungs: Clear to auscultation bilaterally CVS: Regular rate and rhythm, no murmur.  No chest wall tenderness GI/Abd soft, nontender, nondistended, bowel sound present CNS: Alert, awake, oriented x3 Psychiatry: Mood appropriate Extremities: No pedal edema, no calf tenderness  Data Review: I have personally reviewed the laboratory data and studies available.  F/u labs ordered [employer] (From admission, onward)     Start  Ordered   12/25/21 0500  Heparin level (unfractionated)  Once-Timed,   R        12/24/21 0343   Pending  Glucose, random  Once,   R        Pending            Signed, Lorin Glass, MD [facility] 12/24/2021

## 2021-12-24 NOTE — Progress Notes (Signed)
       CROSS COVER NOTE  NAME: Mark Brooks MRN: [id number] DOB : 1965/10/11    Date of Service   12/24/2021   HPI/Events of Note   Notified of elevated CBG--> 461  Interventions   Assessment/Plan:  Give 10U Novolog    This document was prepared using Dragon voice recognition software and may include unintentional dictation errors.  Neomia Glass DNP, MBA, FNP-BC Nurse Practitioner [facility] Pager [phone]

## 2021-12-24 NOTE — Consult Note (Signed)
ANTICOAGULATION CONSULT NOTE - Initial Consult  Pharmacy Consult for Heparin infusion Indication: chest pain/ACS  Allergies  Allergen Reactions   Spironolactone Other (See Comments)    Gynecomastia seen Aug 2023    Patient Measurements: Height: 5\' 7"  (170.2 cm) Weight: 81.6 kg (180 lb) IBW/kg (Calculated) : 66.1 Heparin Dosing Weight: 81.6 kg  Vital Signs: Temp: 98.4 F (36.9 C) (10/14 1837) Temp Source: Oral (10/14 1837) BP: 136/92 (10/15 0330) Pulse Rate: 96 (10/15 0330)  Labs: Recent Labs    12/22/21 1859 12/22/21 2118 12/22/21 2204 12/23/21 0125 12/23/21 1023 12/23/21 1211 12/23/21 1857 12/24/21 0239  HGB 12.7*  --   --   --   --   --   --  11.8*  HCT 40.2  --   --   --   --   --   --  37.1*  PLT 255  --   --   --   --   --   --  221  APTT  --   --  24  --   --   --   --   --   LABPROT  --   --  15.1  --   --   --   --   --   INR  --   --  1.2  --   --   --   --   --   HEPARINUNFRC  --   --   --    < > 0.11* 0.13* 0.41 0.59  CREATININE 2.34*  --   --   --   --   --   --  2.51*  TROPONINIHS 40* 34*  --   --  28*  --   --   --    < > = values in this interval not displayed.     Estimated Creatinine Clearance: 33.2 mL/min (A) (by C-G formula based on SCr of 2.51 mg/dL (H)).   Medical History: Past Medical History:  Diagnosis Date   Coronary artery disease    Diabetes mellitus without complication (HCC)    High cholesterol    Hypertension    Myocardial infarct (HCC)   Heparin Dosing Weight: 81.6 kg  Medications:  (Not in a hospital admission)  Assessment: Patient admitted with chest pain. Pharmacy consulted to manage heparin infusion for ACS. No hx of AC prior to admission noted.   Baseline aPTT and INR ordered per protocol. Baseline CBC appropriate to initiate heparin. Scr 2.34  Date Time aPTT/HL Rate/Comment 10/14 0415 0.24  Subthera; 900 > 1050 un/hr  10/14 1023 0.11  Subthera; 1050 > repeated to confirm 10/14 1211 0.13  Subthera; 1050 > 1350  un/hr  10/14 1857 0.41  Therapeutic x 1 @ 1350 un/hr 10/15   0239   0.59                 Therapeutic X 2   Goal of Therapy:  Heparin level 0.3-0.7 units/ml Monitor platelets by anticoagulation protocol: Yes   Plan:  Continue heparin infusion at 1350 units/hr.  Will recheck HL on 10/16 @ 0500.   Markel Mergenthaler D 12/24/2021 3:44 AM

## 2021-12-25 ENCOUNTER — Inpatient Hospital Stay: Payer: Medicare Other

## 2021-12-25 DIAGNOSIS — I200 Unstable angina: Secondary | ICD-10-CM | POA: Diagnosis not present

## 2021-12-25 LAB — CBC WITH DIFFERENTIAL/PLATELET
Abs Immature Granulocytes: 0.01 10*3/uL (ref 0.00–0.07)
Basophils Absolute: 0 10*3/uL (ref 0.0–0.1)
Basophils Relative: 1 %
Eosinophils Absolute: 0.3 10*3/uL (ref 0.0–0.5)
Eosinophils Relative: 5 %
HCT: 39.3 % (ref 39.0–52.0)
Hemoglobin: 12.7 g/dL — ABNORMAL LOW (ref 13.0–17.0)
Immature Granulocytes: 0 %
Lymphocytes Relative: 31 %
Lymphs Abs: 1.9 10*3/uL (ref 0.7–4.0)
MCH: 27.5 pg (ref 26.0–34.0)
MCHC: 32.3 g/dL (ref 30.0–36.0)
MCV: 85.2 fL (ref 80.0–100.0)
Monocytes Absolute: 0.6 10*3/uL (ref 0.1–1.0)
Monocytes Relative: 9 %
Neutro Abs: 3.3 10*3/uL (ref 1.7–7.7)
Neutrophils Relative %: 54 %
Platelets: 253 10*3/uL (ref 150–400)
RBC: 4.61 MIL/uL (ref 4.22–5.81)
RDW: 12.2 % (ref 11.5–15.5)
WBC: 6.1 10*3/uL (ref 4.0–10.5)
nRBC: 0 % (ref 0.0–0.2)

## 2021-12-25 LAB — BASIC METABOLIC PANEL
Anion gap: 9 (ref 5–15)
BUN: 28 mg/dL — ABNORMAL HIGH (ref 6–20)
CO2: 24 mmol/L (ref 22–32)
Calcium: 8.9 mg/dL (ref 8.9–10.3)
Chloride: 105 mmol/L (ref 98–111)
Creatinine, Ser: 2.57 mg/dL — ABNORMAL HIGH (ref 0.61–1.24)
GFR, Estimated: 28 mL/min — ABNORMAL LOW (ref >60)
Glucose, Bld: 217 mg/dL — ABNORMAL HIGH (ref 70–99)
Potassium: 4.3 mmol/L (ref 3.5–5.1)
Sodium: 138 mmol/L (ref 135–145)

## 2021-12-25 LAB — HEPARIN LEVEL (UNFRACTIONATED): Heparin Unfractionated: 0.68 IU/mL (ref 0.30–0.70)

## 2021-12-25 LAB — GLUCOSE, CAPILLARY
Glucose-Capillary: 244 mg/dL — ABNORMAL HIGH (ref 70–99)
Glucose-Capillary: 185 mg/dL — ABNORMAL HIGH (ref 70–99)
Glucose-Capillary: 130 mg/dL — ABNORMAL HIGH (ref 70–99)

## 2021-12-25 MED ORDER — TECHNETIUM TC 99M TETROFOSMIN IV KIT
10.6100 | PACK | Freq: Once | INTRAVENOUS | Status: AC | PRN
  Administered 2021-12-25: 10.61 via INTRAVENOUS

## 2021-12-25 MED ORDER — INSULIN ASPART PROT & ASPART (70-30 MIX) 100 UNIT/ML ~~LOC~~ SUSP
40.0000 [IU] | Freq: Two times a day (BID) | SUBCUTANEOUS | Status: DC
  Administered 2021-12-25: 40 [IU] via SUBCUTANEOUS

## 2021-12-25 MED ORDER — INSULIN NPH ISOPHANE & REGULAR (70-30) 100 UNIT/ML ~~LOC~~ SUSP: 30.0000 [IU] | Freq: Two times a day (BID) | SUBCUTANEOUS | 11 refills | Status: AC

## 2021-12-25 MED ORDER — TECHNETIUM TC 99M TETROFOSMIN IV KIT
32.5600 | PACK | Freq: Once | INTRAVENOUS | Status: AC | PRN
  Administered 2021-12-25: 32.56 via INTRAVENOUS

## 2021-12-25 MED ORDER — REGADENOSON 0.4 MG/5ML IV SOLN
0.4000 mg | Freq: Once | INTRAVENOUS | Status: AC
  Administered 2021-12-25: 0.4 mg via INTRAVENOUS
  Filled 2021-12-25: qty 5

## 2021-12-25 MED ORDER — NITROGLYCERIN 0.4 MG SL SUBL: 0.4000 mg | SUBLINGUAL_TABLET | SUBLINGUAL | 0 refills | Status: AC | PRN

## 2021-12-25 NOTE — Progress Notes (Signed)
Discharge teaching complete. Meds, diet,activity, follow up appointments and CHF teaching provided and all questions answered. Copy of instructions given to patient.

## 2021-12-25 NOTE — Consult Note (Signed)
ANTICOAGULATION CONSULT NOTE - Initial Consult  Pharmacy Consult for Heparin infusion Indication: chest pain/ACS  Allergies  Allergen Reactions   Spironolactone Other (See Comments)    Gynecomastia seen Aug 2023    Patient Measurements: Height: 5\' 11"  (180.3 cm) Weight: 81.7 kg (180 lb 3.2 oz) IBW/kg (Calculated) : 75.3 Heparin Dosing Weight: 81.6 kg  Vital Signs: Temp: 98.7 F (37.1 C) (10/16 0521) Temp Source: Oral (10/15 2054) BP: 140/99 (10/16 0521) Pulse Rate: 88 (10/16 0521)  Labs: Recent Labs    12/22/21 1859 12/22/21 2118 12/22/21 2204 12/23/21 0125 12/23/21 1023 12/23/21 1211 12/23/21 1857 12/24/21 0239 12/25/21 0608  HGB 12.7*  --   --   --   --   --   --  11.8* 12.7*  HCT 40.2  --   --   --   --   --   --  37.1* 39.3  PLT 255  --   --   --   --   --   --  221 253  APTT  --   --  24  --   --   --   --   --   --   LABPROT  --   --  15.1  --   --   --   --   --   --   INR  --   --  1.2  --   --   --   --   --   --   HEPARINUNFRC  --   --   --    < > 0.11*   < > 0.41 0.59 0.68  CREATININE 2.34*  --   --   --   --   --   --  2.51* 2.57*  TROPONINIHS 40* 34*  --   --  28*  --   --   --   --    < > = values in this interval not displayed.     Estimated Creatinine Clearance: 33.8 mL/min (A) (by C-G formula based on SCr of 2.57 mg/dL (H)).   Medical History: Past Medical History:  Diagnosis Date   Coronary artery disease    Diabetes mellitus without complication (HCC)    High cholesterol    Hypertension    Myocardial infarct (HCC)   Heparin Dosing Weight: 81.6 kg  Medications:  Medications Prior to Admission  Medication Sig Dispense Refill Last Dose   aspirin EC 81 MG tablet Take 81 mg by mouth daily. Swallow whole.   Past Week   buPROPion (WELLBUTRIN SR) 150 MG 12 hr tablet Take 150 mg by mouth 2 (two) times daily.   Past Week   carvedilol (COREG) 12.5 MG tablet Take 12.5 mg by mouth 2 (two) times daily.   Past Week   eplerenone (INSPRA) 25 MG  tablet Take 25 mg by mouth daily.   Past Week   ezetimibe (ZETIA) 10 MG tablet Take 10 mg by mouth daily.   Past Week   furosemide (LASIX) 40 MG tablet Take 1 tablet (40 mg total) by mouth 2 (two) times daily. Increase to 1 tablet (40 mg total) by mouth THREE TIMES daily (total daily dose 120 mg) as needed for up to 3 days for increased leg swelling, shortness of breath, weight gain 5+ lbs over 1-2 days. Seek medical care if these symptoms are not improving with increased dose. 30 tablet 0 Past Week   insulin NPH-regular Human (70-30) 100 UNIT/ML injection Inject 20-25 Units  into the skin 2 (two) times daily. (based upon blood sugar reading)   Past Week   isosorbide mononitrate (IMDUR) 30 MG 24 hr tablet Take 1 tablet (30 mg total) by mouth daily. 30 tablet 0 Past Week   omeprazole (PRILOSEC OTC) 20 MG tablet Take 20 mg by mouth daily.   Past Week   prasugrel (EFFIENT) 10 MG TABS tablet Take 10 mg by mouth daily.   Past Week   rosuvastatin (CRESTOR) 40 MG tablet Take 1 tablet (40 mg total) by mouth daily. 30 tablet 0 Past Week   sacubitril-valsartan (ENTRESTO) 97-103 MG Take 1 tablet by mouth 2 (two) times daily. 60 tablet 0 Past Week   tamsulosin (FLOMAX) 0.4 MG CAPS capsule Take 1 capsule (0.4 mg total) by mouth daily. 30 capsule 0 Past Week   acetaminophen (TYLENOL) 500 MG tablet Take 500 mg by mouth every 6 (six) hours as needed.   prn at prn   dorzolamide-timolol (COSOPT) 22.3-6.8 MG/ML ophthalmic solution Place 1 drop into the right eye 2 (two) times daily. (Patient not taking: Reported on 12/22/2021)   Not Taking   nitroGLYCERIN (NITROSTAT) 0.4 MG SL tablet Place 1 tablet (0.4 mg total) under the tongue every 5 (five) minutes as needed for chest pain. (Patient not taking: Reported on 12/22/2021) 30 tablet 0 Not Taking    Assessment: Patient admitted with chest pain. Pharmacy consulted to manage heparin infusion for ACS. No hx of AC prior to admission noted.   Baseline aPTT and INR ordered  per protocol. Baseline CBC appropriate to initiate heparin. Scr 2.34  Date Time aPTT/HL Rate/Comment 10/14 0415 0.24  Subthera; 900 > 1050 un/hr  10/14 1023 0.11  Subthera; 1050 > repeated to confirm 10/14 1211 0.13  Subthera; 1050 > 1350 un/hr  10/14 1857 0.41  Therapeutic x 1 @ 1350 un/hr 10/15   0239   0.59                 Therapeutic X 2  10/16   0608   0.63                 Therapeutic X 3   Goal of Therapy:  Heparin level 0.3-0.7 units/ml Monitor platelets by anticoagulation protocol: Yes   Plan:  10/16:  HL @ 0608 = 0.63, therapeutic X 3 Will continue pt on current rate and recheck HL on 10/17 with AM labs.   Mishawn Didion D 12/25/2021 7:12 AM

## 2021-12-25 NOTE — Hospital Course (Addendum)
Mark Brooks is a 56 y.o. male with PMH significant for DM1, HTN, HLD, three-vessel CAD, prior STEMI 2021 with a stent to RCA, CKD 3 was recently hospitalized to [facility] 9/5-9/8 for NSTEMI, underwent LHC with DES x2 to RCA with TTE showing LVEF of 40%, discharged on DAPT and multiple GDMT meds.  Of note, 10/4-10/6, patient was hospitalized in this facility for CHF exacerbation which was believed to be related to excessive fluid intake, Echo with EF 35 to 40%, Entresto dose was increased at discharge. 10/13, patient was walking across a parking lot, started having chest pain and shortness of breath and lowered himself down to the ground.  He felt like his blood sugar was low.  Brought to the ED by EMS - hemodynamically stable, breathing on room air. Labs showed CBC unremarkable, BMP with BUN/creatinine elevated to 27/2.34, BNP elevated to 2445, troponin elevated to 40, blood glucose level was 83. EKG shows sinus rhythm at 87 with T wave inversion in lateral leads. Chest x-ray unremarkable. Patient was started on heparin drip plan for 24-48 h total, nitroglycerin ointment, aspirin. Admitted to hospitalist service for CHF. ischemic cardiomyopathy 10/14-10/16: diuresing, on heparin gtt, cardiology following.  Plan Myoview stress test 10/16 --> no ischemia    Consultants:  Cardiology   Procedures: Myoview stress test 12/25/21      ASSESSMENT & PLAN:   Principal Problem:   Unstable angina ([place]) Active Problems:   CAD S/P percutaneous coronary angioplasty   Chronic systolic CHF (congestive heart failure) (HCC)   Stage 3b chronic kidney disease (CKD) (HCC)   Type 1 diabetes mellitus ([place])  Unstable angina ([place]) CAD, NSTEMI 11/14/21  With DES x2 to RCA, STEMI 2021 with stent to RCA,   heparin infusion per cardiology --> d/c  Continue aspirin, carvedilol, ezetimibe, rosuvastatin Continue tx CHF as below  Nitroglycerin sublingual as needed chest pain with morphine for breakthrough Prasugrel to  continue  Cardiology following  Myoview stress test 12/25/21 --> no ischemia   Chronic systolic CHF (congestive heart failure) ([place]) Ischemic cardiomyopathy, EF 35 to 40% 12/13/2021 Appears clinically euvolemic but BNP elevated to 2446.  Chest x-ray clear Continue GDMT with Entresto 97/103, Imdur 30 mg, eplerenone 25 mg daily, carvedilol 12.5 mg daily Continue diuresis --> po on discharge  Strict I&O, daily weights -> see discharge instructions  Continue tx CAD as above   Type 1 diabetes mellitus (HCC) Continue NPH 70/30 with sliding scale coverage --> d/c home on hiher dose 70/30  Stage 3b chronic kidney disease (CKD) ([place]) Renal function at baseline

## 2021-12-25 NOTE — Discharge Summary (Signed)
Physician Discharge Summary   Patient: Mark Brooks MRN: [id number]  DOB: 10/26/1965   Admit:     Date of Admission: 12/22/2021 Admitted from: home   Discharge: Date of discharge: 12/25/21 Disposition: Home Condition at discharge: good  CODE STATUS: FULL CODE      Discharge Physician: Emeterio Reeve, DO [facility]     PCP: Patient, No Pcp Per  Recommendations for Outpatient Follow-up:  Follow up with cardiology team at [place] 1-2 weeks Please follow up on the following pending results: none PCP AND OTHER OUTPATIENT PROVIDERS: SEE BELOW FOR SPECIFIC DISCHARGE INSTRUCTIONS PRINTED FOR PATIENT IN ADDITION TO GENERIC AVS PATIENT INFO     Discharge Diagnoses: Principal Problem:   Unstable angina ([place]) Active Problems:   CAD S/P percutaneous coronary angioplasty   Chronic systolic CHF (congestive heart failure) ([place])   Stage 3b chronic kidney disease (CKD) ([place])   Type 1 diabetes mellitus ([place])     Discharge Instructions     (HEART FAILURE PATIENTS) Call MD:  Anytime you have any of the following symptoms: 1) 3 pound weight gain in 24 hours or 5 pounds in 1 week 2) shortness of breath, with or without a dry hacking cough 3) swelling in the hands, feet or stomach 4) if you have to sleep on extra pillows at night in order to breathe.   Complete by: As directed    Diet - low sodium heart healthy   Complete by: As directed    Diet Carb Modified   Complete by: As directed    Discharge instructions   Complete by: As directed    Chest pain and shortness of breath can occur with your heart conditions. This may bother you from time to time. If symptoms occur with activity, rest as needed. If symptoms occur at rest and are not improving, this is always concerning.  If it happens, try REST if you are having chest pain or trouble breathing, and try taking your NITROGLYCERIN if you have chest pain.  If these measures do not relieve your symptoms, or if you are  concerned for your health in any way and want to be checked out, then it is appropriate to seek emergency medical care!   Increase activity slowly   Complete by: As directed           Hospital Course: Mark Brooks is a 56 y.o. male with PMH significant for DM1, HTN, HLD, three-vessel CAD, prior STEMI 2021 with a stent to RCA, CKD 3 was recently hospitalized to [facility] 9/5-9/8 for NSTEMI, underwent LHC with DES x2 to RCA with TTE showing LVEF of 40%, discharged on DAPT and multiple GDMT meds.  Of note, 10/4-10/6, patient was hospitalized in this facility for CHF exacerbation which was believed to be related to excessive fluid intake, Echo with EF 35 to 40%, Entresto dose was increased at discharge. 10/13, patient was walking across a parking lot, started having chest pain and shortness of breath and lowered himself down to the ground.  He felt like his blood sugar was low.  Brought to the ED by EMS - hemodynamically stable, breathing on room air. Labs showed CBC unremarkable, BMP with BUN/creatinine elevated to 27/2.34, BNP elevated to 2445, troponin elevated to 40, blood glucose level was 83. EKG shows sinus rhythm at 87 with T wave inversion in lateral leads. Chest x-ray unremarkable. Patient was started on heparin drip plan for 24-48 h total, nitroglycerin ointment, aspirin. Admitted to hospitalist service for CHF. ischemic  cardiomyopathy 10/14-10/16: diuresing, on heparin gtt, cardiology following.  Plan Myoview stress test 10/16 --> no ischemia    Consultants:  Cardiology   Procedures: Myoview stress test 12/25/21      ASSESSMENT & PLAN:   Principal Problem:   Unstable angina (HCC) Active Problems:   CAD S/P percutaneous coronary angioplasty   Chronic systolic CHF (congestive heart failure) (HCC)   Stage 3b chronic kidney disease (CKD) (HCC)   Type 1 diabetes mellitus (HCC)  Unstable angina (HCC) CAD, NSTEMI 11/14/21  With DES x2 to RCA, STEMI 2021 with stent to RCA,   heparin  infusion per cardiology --> d/c  Continue aspirin, carvedilol, ezetimibe, rosuvastatin Continue tx CHF as below  Nitroglycerin sublingual as needed chest pain with morphine for breakthrough Prasugrel to continue  Cardiology following  Myoview stress test 12/25/21 --> no ischemia   Chronic systolic CHF (congestive heart failure) (HCC) Ischemic cardiomyopathy, EF 35 to 40% 12/13/2021 Appears clinically euvolemic but BNP elevated to 2446.  Chest x-ray clear Continue GDMT with Entresto 97/103, Imdur 30 mg, eplerenone 25 mg daily, carvedilol 12.5 mg daily Continue diuresis --> po on discharge  Strict I&O, daily weights -> see discharge instructions  Continue tx CAD as above   Type 1 diabetes mellitus (HCC) Continue NPH 70/30 with sliding scale coverage --> d/c home on hiher dose 70/30  Stage 3b chronic kidney disease (CKD) (HCC) Renal function at baseline           Discharge Instructions  Allergies as of 12/25/2021       Reactions   Spironolactone Other (See Comments)   Gynecomastia seen Aug 2023        Medication List     STOP taking these medications    dorzolamide-timolol 2-0.5 % ophthalmic solution Commonly known as: COSOPT       TAKE these medications    acetaminophen 500 MG tablet Commonly known as: TYLENOL Take 500 mg by mouth every 6 (six) hours as needed.   aspirin EC 81 MG tablet Take 81 mg by mouth daily. Swallow whole.   buPROPion 150 MG 12 hr tablet Commonly known as: WELLBUTRIN SR Take 150 mg by mouth 2 (two) times daily.   carvedilol 12.5 MG tablet Commonly known as: COREG Take 12.5 mg by mouth 2 (two) times daily.   eplerenone 25 MG tablet Commonly known as: INSPRA Take 25 mg by mouth daily.   ezetimibe 10 MG tablet Commonly known as: ZETIA Take 10 mg by mouth daily.   furosemide 40 MG tablet Commonly known as: LASIX Take 1 tablet (40 mg total) by mouth 2 (two) times daily. Increase to 1 tablet (40 mg total) by mouth THREE  TIMES daily (total daily dose 120 mg) as needed for up to 3 days for increased leg swelling, shortness of breath, weight gain 5+ lbs over 1-2 days. Seek medical care if these symptoms are not improving with increased dose.   insulin NPH-regular Human (70-30) 100 UNIT/ML injection Inject 30-40 Units into the skin 2 (two) times daily. (based upon blood sugar reading) What changed: how much to take   isosorbide mononitrate 30 MG 24 hr tablet Commonly known as: IMDUR Take 1 tablet (30 mg total) by mouth daily.   nitroGLYCERIN 0.4 MG SL tablet Commonly known as: NITROSTAT Place 1 tablet (0.4 mg total) under the tongue every 5 (five) minutes as needed for chest pain.   omeprazole 20 MG tablet Commonly known as: PRILOSEC OTC Take 20 mg by mouth daily.  prasugrel 10 MG Tabs tablet Commonly known as: EFFIENT Take 10 mg by mouth daily.   rosuvastatin 40 MG tablet Commonly known as: CRESTOR Take 1 tablet (40 mg total) by mouth daily.   sacubitril-valsartan 97-103 MG Commonly known as: ENTRESTO Take 1 tablet by mouth 2 (two) times daily.   tamsulosin 0.4 MG Caps capsule Commonly known as: FLOMAX Take 1 capsule (0.4 mg total) by mouth daily.         Follow-up Information     Victorio Palm. Go in 1 week(s).   Specialty: Cardiology Contact information: [address] [place] [address] [phone]                 Allergies  Allergen Reactions   Spironolactone Other (See Comments)    Gynecomastia seen Aug 2023     Subjective: pt feels okay today, no chest pain or SOB. He states SOB bothers him occasionally, he is depressed he can't do the things he used to (cites really likes dancing, walking - gets easily fatigued now).    Discharge Exam: BP 115/85   Pulse 92   Temp (!) 97.4 F (36.3 C)   Resp 18   Ht 5\' 11"  (1.803 m)   Wt 81.7 kg   SpO2 100%   BMI 25.13 kg/m  General: Pt is alert, awake, not in acute  distress Cardiovascular: RRR, S1/S2 +, no rubs, no gallops Respiratory: CTA bilaterally, no wheezing, no rhonchi Abdominal: Soft, NT, ND, bowel sounds + Extremities: no edema, no cyanosis     The results of significant diagnostics from this hospitalization (including imaging, microbiology, ancillary and laboratory) are listed below for reference.     Microbiology: Recent Results (from the past 240 hour(s))  Resp Panel by RT-PCR (Flu A&B, Covid) Anterior Nasal Swab     Status: None   Collection Time: 12/22/21  7:24 PM   Specimen: Anterior Nasal Swab  Result Value Ref Range Status   SARS Coronavirus 2 by RT PCR NEGATIVE NEGATIVE Final    Comment: (NOTE) SARS-CoV-2 target nucleic acids are NOT DETECTED.  The SARS-CoV-2 RNA is generally detectable in upper respiratory specimens during the acute phase of infection. The lowest concentration of SARS-CoV-2 viral copies this assay can detect is 138 copies/mL. A negative result does not preclude SARS-Cov-2 infection and should not be used as the sole basis for treatment or other patient management decisions. A negative result may occur with  improper specimen collection/handling, submission of specimen other than nasopharyngeal swab, presence of viral mutation(s) within the areas targeted by this assay, and inadequate number of viral copies(<138 copies/mL). A negative result must be combined with clinical observations, patient history, and epidemiological information. The expected result is Negative.  Fact Sheet for Patients:  12/24/21  Fact Sheet for Healthcare Providers:  BloggerCourse.com  This test is no t yet approved or cleared by the [place] FDA and  has been authorized for detection and/or diagnosis of SARS-CoV-2 by FDA under an Emergency Use Authorization (EUA). This EUA will remain  in effect (meaning this test can be used) for the duration of the COVID-19  declaration under Section 564(b)(1) of the Act, 21 U.S.C.section 360bbb-3(b)(1), unless the authorization is terminated  or revoked sooner.       Influenza A by PCR NEGATIVE NEGATIVE Final   Influenza B by PCR NEGATIVE NEGATIVE Final    Comment: (NOTE) The Xpert Xpress SARS-CoV-2/FLU/RSV plus assay is intended as an aid in the diagnosis of influenza  from Nasopharyngeal swab specimens and should not be used as a sole basis for treatment. Nasal washings and aspirates are unacceptable for Xpert Xpress SARS-CoV-2/FLU/RSV testing.  Fact Sheet for Patients: BloggerCourse.com  Fact Sheet for Healthcare Providers: [employer]  This test is not yet approved or cleared by the [place] FDA and has been authorized for detection and/or diagnosis of SARS-CoV-2 by FDA under an Emergency Use Authorization (EUA). This EUA will remain in effect (meaning this test can be used) for the duration of the COVID-19 declaration under Section 564(b)(1) of the Act, 21 U.S.C. section 360bbb-3(b)(1), unless the authorization is terminated or revoked.  Performed at [facility], [address] [place], [place] [address]      Labs: BNP (last 3 results) Recent Labs    12/13/21 1036 12/22/21 1900  BNP 3,426.3* 2,444.6*   Basic Metabolic Panel: Recent Labs  Lab 12/22/21 1859 12/24/21 0239 12/24/21 2333 12/25/21 0608  NA 140 135  --  138  K 3.9 4.0  --  4.3  CL 105 105  --  105  CO2 27 22  --  24  GLUCOSE 83 415* 471* 217*  BUN 27* 27*  --  28*  CREATININE 2.34* 2.51*  --  2.57*  CALCIUM 8.5* 8.5*  --  8.9   Liver Function Tests: Recent Labs  Lab 12/22/21 1859  AST 22  ALT 13  ALKPHOS 50  BILITOT 0.7  PROT 6.7  ALBUMIN 3.0*   No results for input(s): "LIPASE", "AMYLASE" in the last 168 hours. No results for input(s): "AMMONIA" in the last 168 hours. CBC: Recent Labs  Lab 12/22/21 1859 12/24/21 0239  12/25/21 0608  WBC 5.5 6.4 6.1  NEUTROABS 3.3 4.1 3.3  HGB 12.7* 11.8* 12.7*  HCT 40.2 37.1* 39.3  MCV 88.7 86.9 85.2  PLT 255 221 253   Cardiac Enzymes: No results for input(s): "CKTOTAL", "CKMB", "CKMBINDEX", "TROPONINI" in the last 168 hours. BNP: Invalid input(s): "POCBNP" CBG: Recent Labs  Lab 12/24/21 1721 12/24/21 2124 12/25/21 0810 12/25/21 1153 12/25/21 1523  GLUCAP 262* 461* 244* 185* 130*   D-Dimer No results for input(s): "DDIMER" in the last 72 hours. Hgb A1c No results for input(s): "HGBA1C" in the last 72 hours. Lipid Profile No results for input(s): "CHOL", "HDL", "LDLCALC", "TRIG", "CHOLHDL", "LDLDIRECT" in the last 72 hours. Thyroid function studies No results for input(s): "TSH", "T4TOTAL", "T3FREE", "THYROIDAB" in the last 72 hours.  Invalid input(s): "FREET3" Anemia work up No results for input(s): "VITAMINB12", "FOLATE", "FERRITIN", "TIBC", "IRON", "RETICCTPCT" in the last 72 hours. Urinalysis No results found for: "COLORURINE", "APPEARANCEUR", "LABSPEC", "PHURINE", "GLUCOSEU", "HGBUR", "BILIRUBINUR", "KETONESUR", "PROTEINUR", "UROBILINOGEN", "NITRITE", "LEUKOCYTESUR" Sepsis Labs Recent Labs  Lab 12/22/21 1859 12/24/21 0239 12/25/21 0608  WBC 5.5 6.4 6.1   Microbiology Recent Results (from the past 240 hour(s))  Resp Panel by RT-PCR (Flu A&B, Covid) Anterior Nasal Swab     Status: None   Collection Time: 12/22/21  7:24 PM   Specimen: Anterior Nasal Swab  Result Value Ref Range Status   SARS Coronavirus 2 by RT PCR NEGATIVE NEGATIVE Final    Comment: (NOTE) SARS-CoV-2 target nucleic acids are NOT DETECTED.  The SARS-CoV-2 RNA is generally detectable in upper respiratory specimens during the acute phase of infection. The lowest concentration of SARS-CoV-2 viral copies this assay can detect is 138 copies/mL. A negative result does not preclude SARS-Cov-2 infection and should not be used as the sole basis for treatment or other patient  management decisions. A  negative result may occur with  improper specimen collection/handling, submission of specimen other than nasopharyngeal swab, presence of viral mutation(s) within the areas targeted by this assay, and inadequate number of viral copies(<138 copies/mL). A negative result must be combined with clinical observations, patient history, and epidemiological information. The expected result is Negative.  Fact Sheet for Patients:  BloggerCourse.com  Fact Sheet for Healthcare Providers:  [employer]  This test is no t yet approved or cleared by the [place] FDA and  has been authorized for detection and/or diagnosis of SARS-CoV-2 by FDA under an Emergency Use Authorization (EUA). This EUA will remain  in effect (meaning this test can be used) for the duration of the COVID-19 declaration under Section 564(b)(1) of the Act, 21 U.S.C.section 360bbb-3(b)(1), unless the authorization is terminated  or revoked sooner.       Influenza A by PCR NEGATIVE NEGATIVE Final   Influenza B by PCR NEGATIVE NEGATIVE Final    Comment: (NOTE) The Xpert Xpress SARS-CoV-2/FLU/RSV plus assay is intended as an aid in the diagnosis of influenza from Nasopharyngeal swab specimens and should not be used as a sole basis for treatment. Nasal washings and aspirates are unacceptable for Xpert Xpress SARS-CoV-2/FLU/RSV testing.  Fact Sheet for Patients: BloggerCourse.com  Fact Sheet for Healthcare Providers: [employer]it  This test is not yet approved or cleared by the [place] FDA and has been authorized for detection and/or diagnosis of SARS-CoV-2 by FDA under an Emergency Use Authorization (EUA). This EUA will remain in effect (meaning this test can be used) for the duration of the COVID-19 declaration under Section 564(b)(1) of the Act, 21 U.S.C. section 360bbb-3(b)(1),  unless the authorization is terminated or revoked.  Performed at [facility], [address] [place], [place] [address]    Imaging DG Chest 2 View  Result Date: 12/22/2021 CLINICAL DATA:  Chest pain and shortness of breath. EXAM: CHEST - 2 VIEW COMPARISON:  December 14, 2021 FINDINGS: The heart size and mediastinal contours are within normal limits. There is no evidence of acute infiltrate, pleural effusion or pneumothorax. The visualized skeletal structures are unremarkable. IMPRESSION: No active cardiopulmonary disease. Electronically Signed   By: Aram Candela M.D.   On: 12/22/2021 19:18      Time coordinating discharge: over 30 minutes  SIGNED:  Sunnie Nielsen DO [facility]

## 2021-12-25 NOTE — TOC CM/SW Note (Signed)
  Transition of Care [facility]) Screening Note   Patient Details  Name: Mark Brooks Date of Birth: June 09, 1965   Transition of Care [facility]) CM/SW Contact:    Candie Chroman, LCSW Phone Number: 12/25/2021, 1:08 PM    Transition of Care Department [facility]) has reviewed patient and no TOC needs have been identified at this time. We will continue to monitor patient advancement through interdisciplinary progression rounds. If new patient transition needs arise, please place a TOC consult.

## 2021-12-25 NOTE — Inpatient Diabetes Management (Signed)
Inpatient Diabetes Program Recommendations  AACE/ADA: New Consensus Statement on Inpatient Glycemic Control   Target Ranges:  Prepandial:   less than 140 mg/dL      Peak postprandial:   less than 180 mg/dL (1-2 hours)      Critically ill patients:  140 - 180 mg/dL    Latest Reference Range & Units 12/24/21 07:52 12/24/21 11:30 12/24/21 17:21 12/24/21 21:24 12/25/21 08:10  Glucose-Capillary 70 - 99 mg/dL 334 (H) 269 (H) 262 (H) 461 (H) 244 (H)    Latest Reference Range & Units 12/14/21 04:35  Hemoglobin A1C 4.8 - 5.6 % 11.2 (H)   Review of Glycemic Control  Diabetes history: DM2 Outpatient Diabetes medications: 70/30 20-25 units BID Current orders for Inpatient glycemic control: 70/30 30 units BID, Novolog 0-15 units TID with meals, Novolog 0-5 units QHS  Inpatient Diabetes Program Recommendations:    Insulin: Please consider increasing 70/30 to 40 units BID.  NOTE: Patient admitted with angina. Patient was recently inpatient 12/13/21-12/15/21 and inpatient diabetes coordinator spoke with patient on 12/14/21; patient had reported taking 70/30 20 units QAM and 70/30 30 units QPM. No changes made to outpatient insulin regimen at discharge on 12/15/21.  Thanks, Barnie Alderman, RN, MSN, [place] Diabetes Coordinator Inpatient Diabetes Program [phone] (Team Pager from 8am to [place])

## 2021-12-26 LAB — NM MYOCAR MULTI W/SPECT W/WALL MOTION / EF
Estimated workload: 1
Exercise duration (min): 1 min
Exercise duration (sec): 5 s
LV dias vol: 188 mL (ref 62–150)
LV sys vol: 144 mL
MPHR: 163 {beats}/min
Nuc Stress EF: 23 %
Peak HR: 94 {beats}/min
Percent HR: 57 %
Rest HR: 81 {beats}/min
Rest Nuclear Isotope Dose: 10.6 mCi
SDS: 10
SRS: 5
SSS: 15
ST Depression (mm): 0 mm
Stress Nuclear Isotope Dose: 32.6 mCi
TID: 0.99

## 2022-01-22 ENCOUNTER — Other Ambulatory Visit (HOSPITAL_BASED_OUTPATIENT_CLINIC_OR_DEPARTMENT_OTHER): Payer: Self-pay | Admitting: Osteopathic Medicine

## 2022-01-23 ENCOUNTER — Other Ambulatory Visit (HOSPITAL_BASED_OUTPATIENT_CLINIC_OR_DEPARTMENT_OTHER): Payer: Self-pay | Admitting: Osteopathic Medicine

## 2022-02-14 IMAGING — CR DG SHOULDER 2+V*R*
3 series · 3 of 3 positions shown · non-contrast
Comparison: None.

CLINICAL DATA: Chronic right shoulder pain.

EXAM:
RIGHT SHOULDER - 2+ VIEW

[shoulder grashey (1 of 2)]
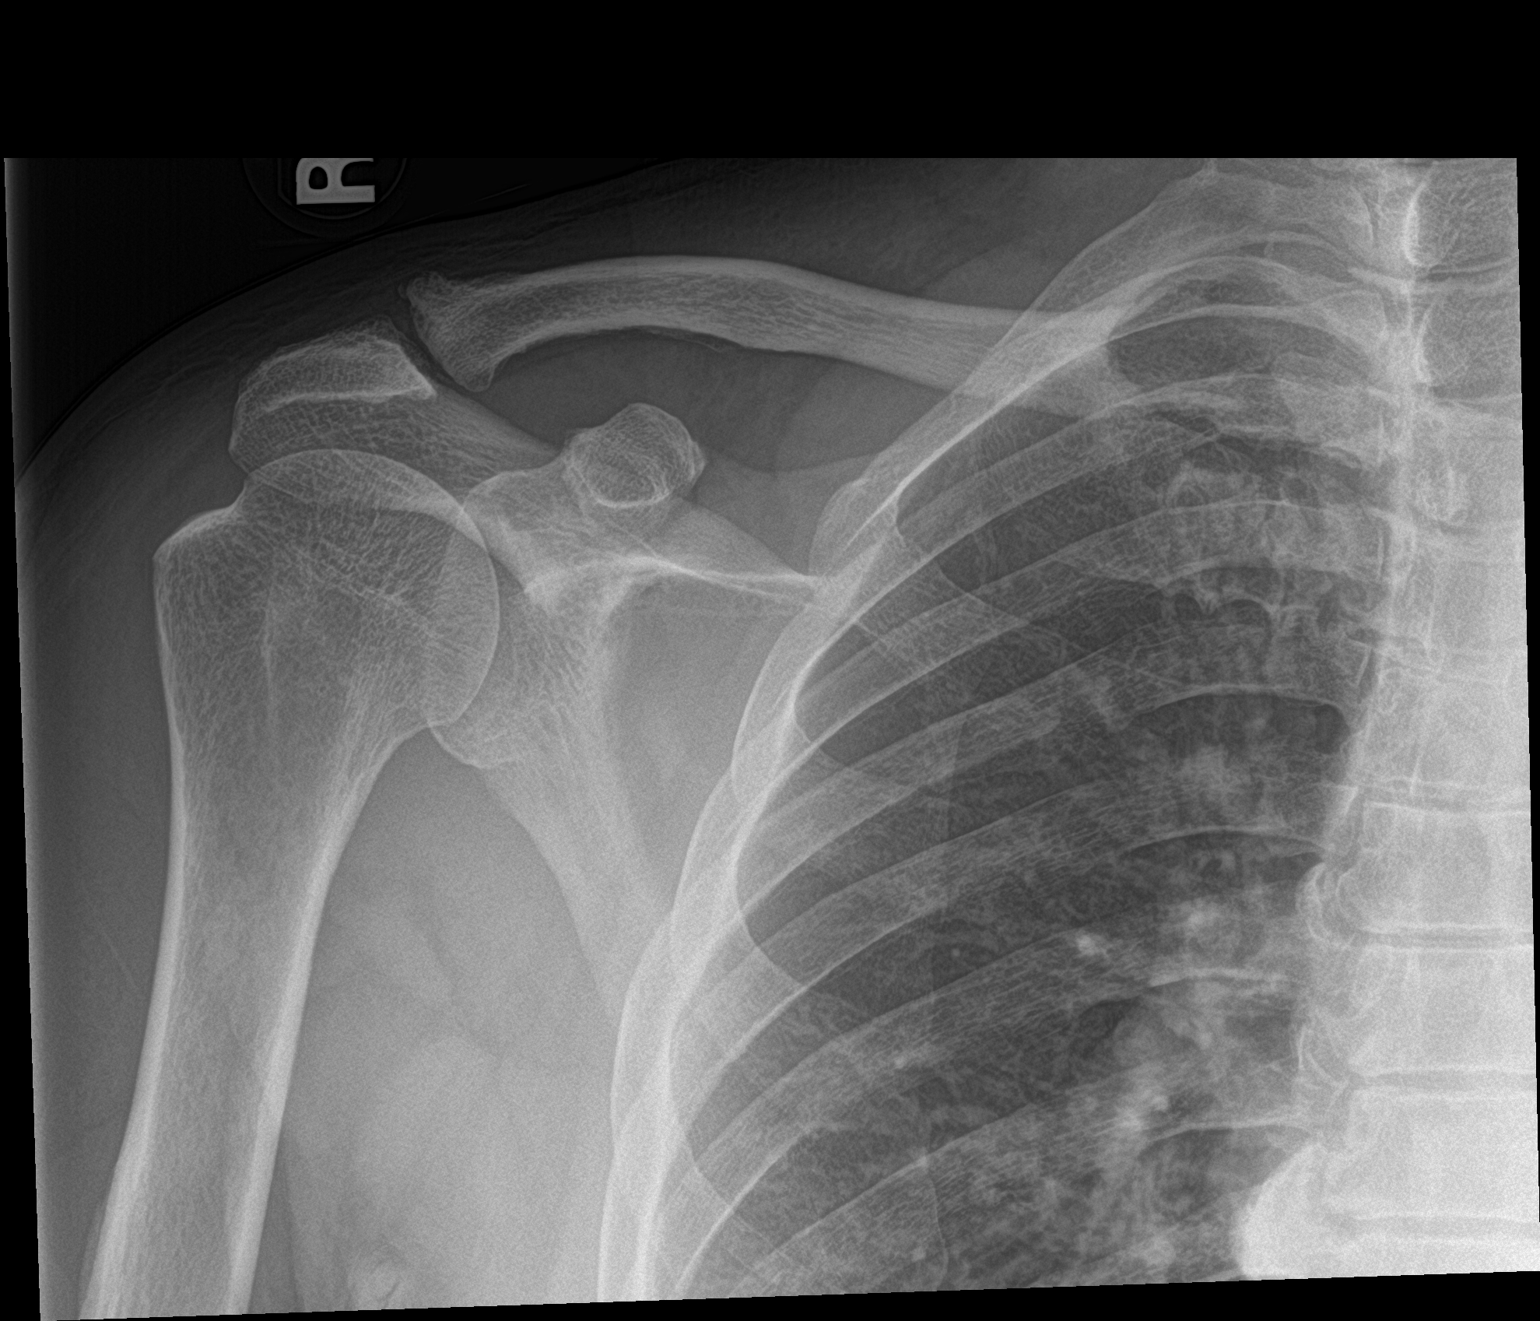

[shoulder y view]
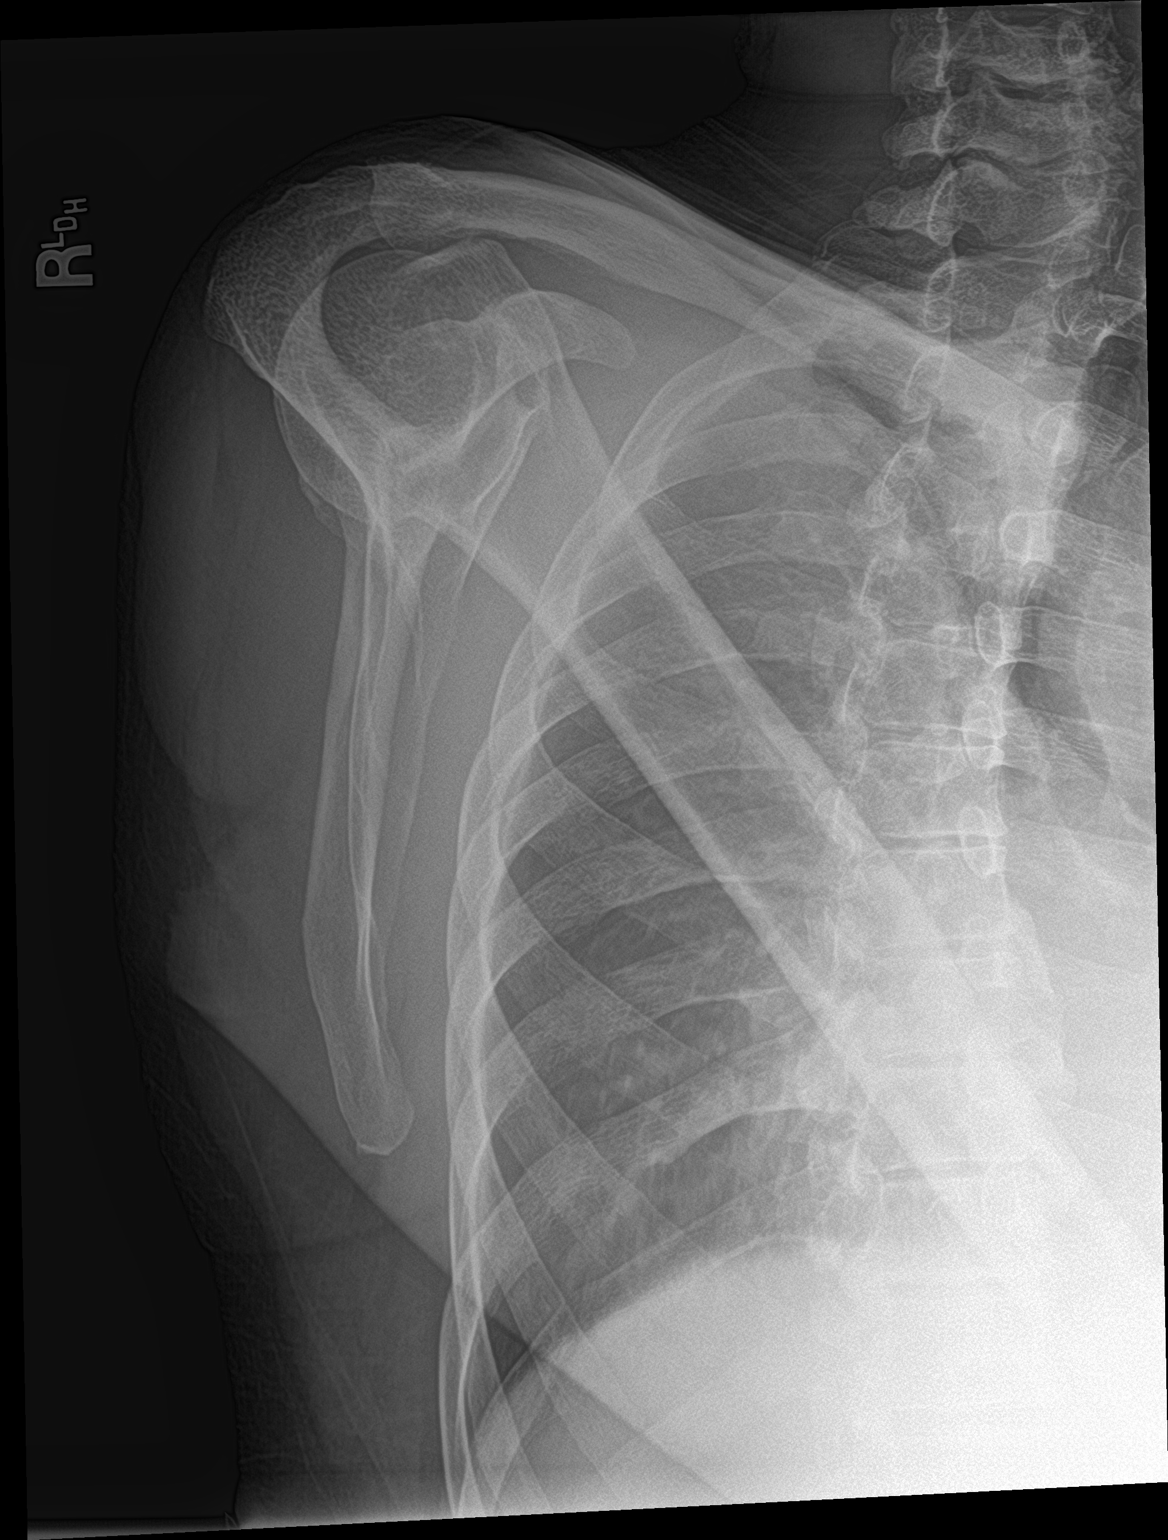

[shoulder grashey (2 of 2)]
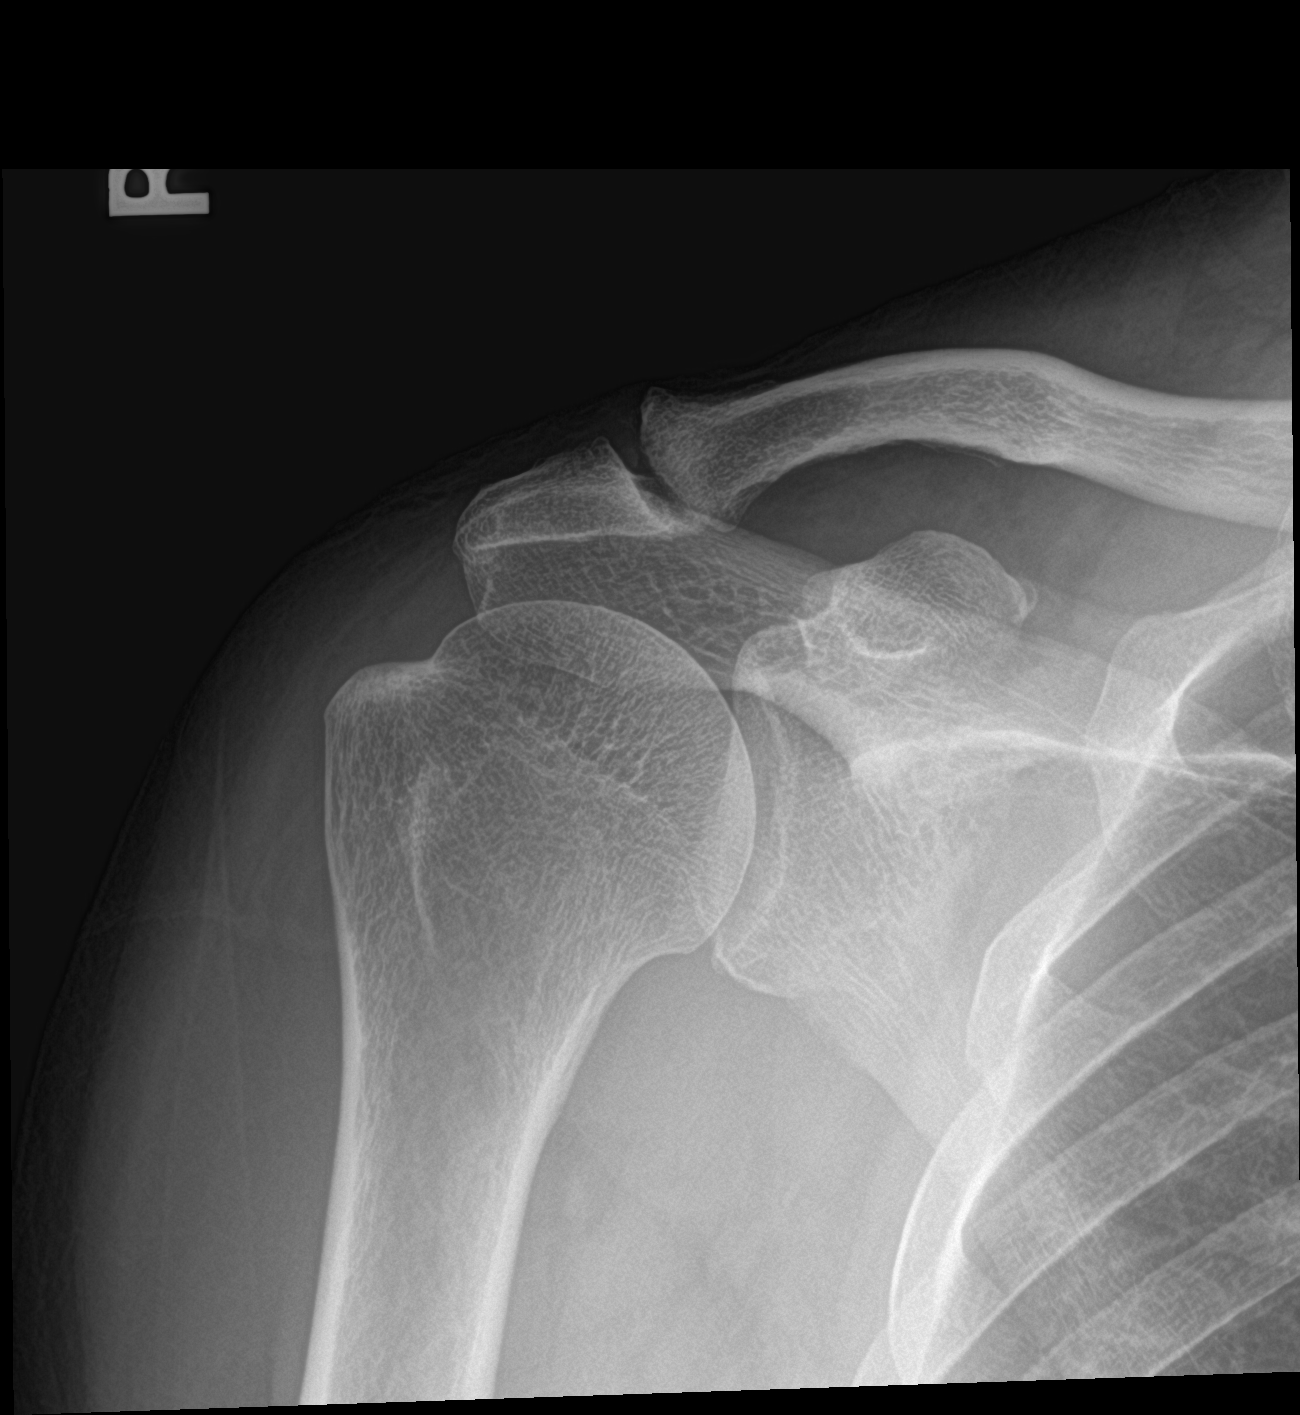

[3 of 3 positions shown; findings below may reference images not displayed]

FINDINGS: There is no evidence of fracture or dislocation. Mild degenerative
changes seen involving the right acromioclavicular joint. Soft
tissues are unremarkable.
IMPRESSION: Mild degenerative joint disease of the right acromioclavicular
joint. No acute abnormality seen in the right shoulder.

## 2022-03-06 ENCOUNTER — Emergency Department: Payer: Medicare Other

## 2022-03-06 ENCOUNTER — Emergency Department
Admission: EM | Admit: 2022-03-06 | Discharge: 2022-03-06 | Disposition: A | Discharge status: Home / Self Care | Payer: Medicare Other | Attending: Emergency Medicine | Admitting: Emergency Medicine

## 2022-03-06 ENCOUNTER — Other Ambulatory Visit: Payer: Self-pay

## 2022-03-06 DIAGNOSIS — M79641 Pain in right hand: Secondary | ICD-10-CM | POA: Diagnosis present

## 2022-03-06 DIAGNOSIS — M50322 Other cervical disc degeneration at C5-C6 level: Secondary | ICD-10-CM | POA: Insufficient documentation

## 2022-03-06 DIAGNOSIS — M79601 Pain in right arm: Secondary | ICD-10-CM

## 2022-03-06 DIAGNOSIS — Z1152 Encounter for screening for COVID-19: Secondary | ICD-10-CM | POA: Diagnosis not present

## 2022-03-06 DIAGNOSIS — R000 Tachycardia, unspecified: Secondary | ICD-10-CM | POA: Insufficient documentation

## 2022-03-06 DIAGNOSIS — E119 Type 2 diabetes mellitus without complications: Secondary | ICD-10-CM | POA: Diagnosis not present

## 2022-03-06 DIAGNOSIS — Z91141 Patient's other noncompliance with medication regimen due to financial hardship: Secondary | ICD-10-CM | POA: Diagnosis not present

## 2022-03-06 DIAGNOSIS — M5413 Radiculopathy, cervicothoracic region: Secondary | ICD-10-CM | POA: Insufficient documentation

## 2022-03-06 LAB — RESP PANEL BY RT-PCR (RSV, FLU A&B, COVID)  RVPGX2
Influenza A by PCR: NEGATIVE
Influenza B by PCR: NEGATIVE
Resp Syncytial Virus by PCR: NEGATIVE
SARS Coronavirus 2 by RT PCR: NEGATIVE

## 2022-03-06 LAB — CBC WITH DIFFERENTIAL/PLATELET
Abs Immature Granulocytes: 0.02 10*3/uL (ref 0.00–0.07)
Basophils Absolute: 0 10*3/uL (ref 0.0–0.1)
Basophils Relative: 1 %
Eosinophils Absolute: 0.2 10*3/uL (ref 0.0–0.5)
Eosinophils Relative: 3 %
HCT: 42.2 % (ref 39.0–52.0)
Hemoglobin: 13 g/dL (ref 13.0–17.0)
Immature Granulocytes: 0 %
Lymphocytes Relative: 13 %
Lymphs Abs: 1 10*3/uL (ref 0.7–4.0)
MCH: 26.1 pg (ref 26.0–34.0)
MCHC: 30.8 g/dL (ref 30.0–36.0)
MCV: 84.7 fL (ref 80.0–100.0)
Monocytes Absolute: 0.4 10*3/uL (ref 0.1–1.0)
Monocytes Relative: 6 %
Neutro Abs: 5.6 10*3/uL (ref 1.7–7.7)
Neutrophils Relative %: 77 %
Platelets: 221 10*3/uL (ref 150–400)
RBC: 4.98 MIL/uL (ref 4.22–5.81)
RDW: 15.1 % (ref 11.5–15.5)
WBC: 7.2 10*3/uL (ref 4.0–10.5)
nRBC: 0 % (ref 0.0–0.2)

## 2022-03-06 LAB — BASIC METABOLIC PANEL
Anion gap: 9 (ref 5–15)
BUN: 31 mg/dL — ABNORMAL HIGH (ref 6–20)
CO2: 25 mmol/L (ref 22–32)
Calcium: 9 mg/dL (ref 8.9–10.3)
Chloride: 104 mmol/L (ref 98–111)
Creatinine, Ser: 2.27 mg/dL — ABNORMAL HIGH (ref 0.61–1.24)
GFR, Estimated: 33 mL/min — ABNORMAL LOW (ref >60)
Glucose, Bld: 313 mg/dL — ABNORMAL HIGH (ref 70–99)
Potassium: 4.3 mmol/L (ref 3.5–5.1)
Sodium: 138 mmol/L (ref 135–145)

## 2022-03-06 MED ORDER — GABAPENTIN 300 MG PO CAPS: 300.0000 mg | ORAL_CAPSULE | Freq: Three times a day (TID) | ORAL | 2 refills | Status: AC

## 2022-03-06 MED ORDER — IOHEXOL 350 MG/ML SOLN
75.0000 mL | Freq: Once | INTRAVENOUS | Status: AC | PRN
  Administered 2022-03-06: 75 mL via INTRAVENOUS

## 2022-03-06 NOTE — ED Provider Notes (Signed)
[facility] Provider Note   Event Date/Time   First MD Initiated Contact with Patient 03/06/22 1315     (approximate) History  Arm Pain (Pt. To ED POV for right hand pain that radiates up lateral aspect of arm and into right shoulder blade. Pt. States he has been diagnosed with nerve/spine issues that cause this pain and was prescribed gabapentin, was unable to follow up at the time. )  HPI Mark Brooks is a 56 y.o. male with a stated past medical history of poorly controlled diabetes and chronic cervical/thoracic radiculopathy who presents for right upper extremity numbness and tingling that has been worsening over the last few months after patient has been out of his gabapentin due to financial constraints.  Patient states that up until recently he was homeless without a primary care physician and was unable to follow-up for refills of his medications or continuing workup for this radicular pain that radiates down into the right upper extremity.  Patient denies any color change in this extremity, recent trauma, or obvious deformity ROS: Patient currently denies any vision changes, tinnitus, difficulty speaking, facial droop, sore throat, chest pain, shortness of breath, abdominal pain, nausea/vomiting/diarrhea, dysuria   Physical Exam  Triage Vital Signs: ED Triage Vitals  Enc Vitals Group     BP 03/06/22 1003 (!) 175/115     Pulse Rate 03/06/22 1003 (!) 103     Resp 03/06/22 1003 18     Temp 03/06/22 1003 100.2 F (37.9 C)     Temp Source 03/06/22 1003 Oral     SpO2 03/06/22 1003 98 %     Weight --      Height 03/06/22 0953 5\' 7"  (1.702 m)     Head Circumference --      Peak Flow --      Pain Score 03/06/22 0953 10     Pain Loc --      Pain Edu? --      Excl. in [place]? --    Most recent vital signs: Vitals:   03/06/22 1003  BP: (!) 175/115  Pulse: (!) 103  Resp: 18  Temp: 100.2 F (37.9 C)  SpO2: 98%   General: Awake, oriented x4. CV:  Good  peripheral perfusion.  Resp:  Normal effort.  Abd:  No distention.  Other:  32 African-American male laying in bed in no acute distress.  Subjective paresthesias over the thumb index and middle finger as well as numbness  ED Results / Procedures / Treatments  Labs (all labs ordered are listed, but only abnormal results are displayed) Labs Reviewed  BASIC METABOLIC PANEL - Abnormal; Notable for the following components:      Result Value   Glucose, Bld 313 (*)    BUN 31 (*)    Creatinine, Ser 2.27 (*)    GFR, Estimated 33 (*)    All other components within normal limits  RESP PANEL BY RT-PCR (RSV, FLU A&B, COVID)  [id number]  CBC WITH DIFFERENTIAL/PLATELET   EKG ED ECG REPORT I, Naaman Plummer, the attending physician, personally viewed and interpreted this ECG. Date: 03/06/2022 EKG Time: 1004 Rate: 103 Rhythm: normal sinus rhythm QRS Axis: normal Intervals: normal ST/T Wave abnormalities: normal Narrative Interpretation: no evidence of acute ischemia RADIOLOGY ED MD interpretation: CT angiography of the right upper extremity interpreted independently by me and shows no evidence of arterial occlusion up to the level of the elbow.  Ultrasound Doppler of the right upper extremity shows no  evidence of acute DVT.  This imaging was independently interpreted by me. -Agree with radiology assessment Official radiology report(s): CT ANGIO UP EXTREM RIGHT W &/OR WO CONTRAST  Result Date: 03/06/2022 CLINICAL DATA:  Right hand pain radiating proximally and right upper extremity. Concern for acute arterial occlusion clinically. EXAM: CT ANGIOGRAPHY OF THE RIGHT UPPER EXTREMITY TECHNIQUE: Multidetector CT imaging of the right upper extremity was performed using the standard protocol during bolus administration of intravenous contrast. Multiplanar CT image reconstructions and MIPs were obtained to evaluate the vascular anatomy. RADIATION DOSE REDUCTION: This exam was performed according to  the departmental dose-optimization program which includes automated exposure control, adjustment of the mA and/or kV according to patient size and/or use of iterative reconstruction technique. CONTRAST:  35mL OMNIPAQUE IOHEXOL 350 MG/ML SOLN COMPARISON:  None Available. FINDINGS: VASCULAR: Aortic arch demonstrates normal patency and mild atherosclerosis. Proximal great vessels demonstrate normal patency and branching anatomy. The innominate artery, carotid arteries and right subclavian artery demonstrate normal patency. Visualized proximal right vertebral artery demonstrates normal patency. Right axillary and brachial arteries demonstrate normal patency. There is high origin of the right radial artery in the upper arm which is a normal variant. Due to timing of imaging and contrast bolus, arterial patency can only be determined to the level of the elbow. Arterial opacification distal to the elbow is inadequate for interpretation and correlation suggested with clinical pulse examination at the level of the wrist. NON-VASCULAR: Visualized mediastinum, upper extremity and neck demonstrate no acute abnormalities. No soft tissue masses or inflammatory process identified. Bony structures demonstrate degenerative disc disease of the cervical spine predominantly at the C5-6 and C6-7 levels. Review of the MIP images confirms the above findings. IMPRESSION: 1. Due to timing of imaging and contrast bolus, arterial patency can only be determined to the level of the elbow. Arterial patency to this level is normal supplying the right upper extremity. There is incidental normal variant high proximal origin of the right radial artery. Arterial opacification distal to the elbow is inadequate for interpretation and correlation suggested with clinical pulse examination at the level of the wrist. 2. Mild atherosclerosis of the aortic arch. 3. Degenerative disc disease of the cervical spine at C5-6 and C6-7. Aortic Atherosclerosis  (ICD10-I70.0). Electronically Signed   By: Irish Lack M.D.   On: 03/06/2022 13:04   US Venous Img Upper Uni Right(DVT)  Result Date: 03/06/2022 CLINICAL DATA:  Right arm pain and numbness. EXAM: RIGHT UPPER EXTREMITY VENOUS DOPPLER ULTRASOUND TECHNIQUE: Gray-scale sonography with graded compression, as well as color Doppler and duplex ultrasound were performed to evaluate the upper extremity deep venous system from the level of the subclavian vein and including the jugular, axillary, basilic, radial, ulnar and upper cephalic vein. Spectral Doppler was utilized to evaluate flow at rest and with distal augmentation maneuvers. COMPARISON:  None Available. FINDINGS: Contralateral Subclavian Vein: Respiratory phasicity is normal and symmetric with the symptomatic side. No evidence of thrombus. Normal compressibility. Internal Jugular Vein: No evidence of thrombus. Normal compressibility, respiratory phasicity and response to augmentation. Subclavian Vein: No evidence of thrombus. Normal compressibility, respiratory phasicity and response to augmentation. Axillary Vein: No evidence of thrombus. Normal compressibility, respiratory phasicity and response to augmentation. Cephalic Vein: No evidence of thrombus. Normal compressibility, respiratory phasicity and response to augmentation. Basilic Vein: No evidence of thrombus. Normal compressibility, respiratory phasicity and response to augmentation. Brachial Veins: No evidence of thrombus. Normal compressibility, respiratory phasicity and response to augmentation. Radial Veins: No evidence of thrombus. Normal compressibility,  respiratory phasicity and response to augmentation. Ulnar Veins: No evidence of thrombus. Normal compressibility, respiratory phasicity and response to augmentation. Venous Reflux:  None visualized. Other Findings: No evidence of superficial thrombophlebitis or abnormal fluid collection. IMPRESSION: No evidence of DVT within the right upper  extremity. Electronically Signed   By: Aletta Edouard M.D.   On: 03/06/2022 12:34   PROCEDURES: Critical Care performed: No Procedures MEDICATIONS ORDERED IN ED: Medications  iohexol (OMNIPAQUE) 350 MG/ML injection 75 mL (75 mLs Intravenous Contrast Given 03/06/22 1102)   IMPRESSION / MDM / ASSESSMENT AND PLAN / ED COURSE  I reviewed the triage vital signs and the nursing notes.                             The patient is on the cardiac monitor to evaluate for evidence of arrhythmia and/or significant heart rate changes. Patient's presentation is most consistent with acute presentation with potential threat to life or bodily function. Presents with sensation of tingling to the right upper extremity.  Patients symptoms and work-up not consistent with a stroke and therefore they will be discharged from the ED.  Patient has currently been stabilized in the emergency department.  Patient received Doppler ultrasound and CT angiography of the right upper extremity to the doctor any evidence of occlusion.  Upon  Patients symptoms not typical for other emergent causes such as dissection, infection, DKA, trauma.  Symptoms likely related to patient's history of cervical/thoracic radiculopathy and patient was given a prescription for his home dose of gabapentin as well as physical therapy exercises to perform.  Patient was also given contact information for spinal surgery as needed.  Patient will be discharged with strict return precautions and follow up with primary MD within 24 hours for further evaluation.   FINAL CLINICAL IMPRESSION(S) / ED DIAGNOSES   Final diagnoses:  Right arm pain  Radiculopathy of cervicothoracic region   Rx / DC Orders   ED Discharge Orders          Ordered    gabapentin (NEURONTIN) 300 MG capsule  3 times daily        03/06/22 1344           Note:  This document was prepared using Dragon voice recognition software and may include unintentional dictation  errors.   Naaman Plummer, MD 03/06/22 [phone]

## 2022-03-06 NOTE — ED Triage Notes (Signed)
Pt. To ED POV for right hand pain that radiates up lateral aspect of arm and into right shoulder blade. Pt. States he has been diagnosed with nerve/spine issues that cause this pain and was prescribed gabapentin, was unable to follow up at the time. Brandon PA at chair side in triage.

## 2022-03-06 NOTE — ED Provider Triage Note (Signed)
  Emergency Medicine Provider Triage Evaluation Note  Mark Brooks , a 56 y.o.male,  was evaluated in triage.  Pt complains of pain/numbness in the right upper extremity.  He states that has been going on for the past 2 days.  He states that he has a history of cervical spine issues and has had nerve problems, however this feels different.  He states that his right arm feels numb and cold.   Review of Systems  Positive: Pain/numbness in right upper extremity. Negative: Denies fever, chest pain, vomiting  Physical Exam  There were no vitals filed for this visit. Gen:   Awake, appears anxious and uncomfortable. Resp:  Normal effort  MSK:   Moves extremities without difficulty  Other:  Normal radial pulse distally.  Right upper extremity does feel notably cold.  Normal grip strength and range of motion.  Medical Decision Making  Given the patient's initial medical screening exam, the following diagnostic evaluation has been ordered. The patient will be placed in the appropriate treatment space, once one is available, to complete the evaluation and treatment. I have discussed the plan of care with the patient and I have advised the patient that an ED physician or mid-level practitioner will reevaluate their condition after the test results have been received, as the results may give them additional insight into the type of treatment they may need.    Diagnostics: Labs, ultrasound, CT angiogram.  Treatments: none immediately   Varney Daily, PA 03/06/22 1000

## 2022-03-06 NOTE — ED Notes (Signed)
Pt to [place] from triage. Blue top in lab. Labs sent

## 2022-03-13 ENCOUNTER — Telehealth: Payer: Self-pay | Admitting: Neurosurgery

## 2022-03-13 NOTE — Telephone Encounter (Signed)
Patient seen in the ER on 03/06/22 for cervical radiculopathy. Per Dr.Yarbrough patient can follow-up with Marzetta Board or Danielle.  Patient was contacted and a message was left on phone number in his chart, to call the office for an appt.

## 2022-03-21 NOTE — Telephone Encounter (Signed)
Patient declined appointment.

## 2022-09-29 ENCOUNTER — Other Ambulatory Visit: Payer: Self-pay

## 2022-09-29 ENCOUNTER — Emergency Department: Payer: Medicare Other

## 2022-09-29 ENCOUNTER — Emergency Department
Admission: EM | Admit: 2022-09-29 | Discharge: 2022-09-29 | Disposition: A | Discharge status: Home / Self Care | Payer: Medicare Other | Attending: Emergency Medicine | Admitting: Emergency Medicine

## 2022-09-29 ENCOUNTER — Encounter: Payer: Self-pay | Admitting: Emergency Medicine

## 2022-09-29 DIAGNOSIS — M792 Neuralgia and neuritis, unspecified: Secondary | ICD-10-CM

## 2022-09-29 DIAGNOSIS — R079 Chest pain, unspecified: Secondary | ICD-10-CM | POA: Insufficient documentation

## 2022-09-29 DIAGNOSIS — E1022 Type 1 diabetes mellitus with diabetic chronic kidney disease: Secondary | ICD-10-CM | POA: Insufficient documentation

## 2022-09-29 DIAGNOSIS — R0789 Other chest pain: Secondary | ICD-10-CM

## 2022-09-29 DIAGNOSIS — I130 Hypertensive heart and chronic kidney disease with heart failure and stage 1 through stage 4 chronic kidney disease, or unspecified chronic kidney disease: Secondary | ICD-10-CM | POA: Insufficient documentation

## 2022-09-29 DIAGNOSIS — I2510 Atherosclerotic heart disease of native coronary artery without angina pectoris: Secondary | ICD-10-CM | POA: Diagnosis not present

## 2022-09-29 DIAGNOSIS — I509 Heart failure, unspecified: Secondary | ICD-10-CM | POA: Insufficient documentation

## 2022-09-29 DIAGNOSIS — N189 Chronic kidney disease, unspecified: Secondary | ICD-10-CM | POA: Diagnosis not present

## 2022-09-29 LAB — HEPATIC FUNCTION PANEL
ALT: 24 U/L (ref 0–44)
AST: 26 U/L (ref 15–41)
Albumin: 3.8 g/dL (ref 3.5–5.0)
Alkaline Phosphatase: 91 U/L (ref 38–126)
Bilirubin, Direct: 0.2 mg/dL (ref 0.0–0.2)
Indirect Bilirubin: 0.8 mg/dL (ref 0.3–0.9)
Total Bilirubin: 1 mg/dL (ref 0.3–1.2)
Total Protein: 7.9 g/dL (ref 6.5–8.1)

## 2022-09-29 LAB — TROPONIN I (HIGH SENSITIVITY)
Troponin I (High Sensitivity): 54 ng/L — ABNORMAL HIGH (ref <18)
Troponin I (High Sensitivity): 53 ng/L — ABNORMAL HIGH (ref <18)

## 2022-09-29 LAB — BASIC METABOLIC PANEL
Anion gap: 11 (ref 5–15)
BUN: 48 mg/dL — ABNORMAL HIGH (ref 6–20)
CO2: 23 mmol/L (ref 22–32)
Calcium: 8.6 mg/dL — ABNORMAL LOW (ref 8.9–10.3)
Chloride: 96 mmol/L — ABNORMAL LOW (ref 98–111)
Creatinine, Ser: 2.68 mg/dL — ABNORMAL HIGH (ref 0.61–1.24)
GFR, Estimated: 27 mL/min — ABNORMAL LOW (ref >60)
Glucose, Bld: 306 mg/dL — ABNORMAL HIGH (ref 70–99)
Potassium: 4.6 mmol/L (ref 3.5–5.1)
Sodium: 130 mmol/L — ABNORMAL LOW (ref 135–145)

## 2022-09-29 LAB — CBC
HCT: 43.9 % (ref 39.0–52.0)
Hemoglobin: 13.7 g/dL (ref 13.0–17.0)
MCH: 26.7 pg (ref 26.0–34.0)
MCHC: 31.2 g/dL (ref 30.0–36.0)
MCV: 85.6 fL (ref 80.0–100.0)
Platelets: 183 10*3/uL (ref 150–400)
RBC: 5.13 MIL/uL (ref 4.22–5.81)
RDW: 14 % (ref 11.5–15.5)
WBC: 4.4 10*3/uL (ref 4.0–10.5)
nRBC: 0 % (ref 0.0–0.2)

## 2022-09-29 LAB — BRAIN NATRIURETIC PEPTIDE: B Natriuretic Peptide: 257.2 pg/mL — ABNORMAL HIGH (ref 0.0–100.0)

## 2022-09-29 LAB — LACTIC ACID, PLASMA: Lactic Acid, Venous: 1.2 mmol/L (ref 0.5–1.9)

## 2022-09-29 LAB — LIPASE, BLOOD: Lipase: 28 U/L (ref 11–51)

## 2022-09-29 MED ORDER — PANTOPRAZOLE SODIUM 40 MG IV SOLR
40.0000 mg | Freq: Once | INTRAVENOUS | Status: AC
  Administered 2022-09-29: 40 mg via INTRAVENOUS
  Filled 2022-09-29: qty 10

## 2022-09-29 MED ORDER — OXYCODONE HCL 5 MG PO TABS
5.0000 mg | ORAL_TABLET | Freq: Once | ORAL | Status: DC
  Filled 2022-09-29: qty 1

## 2022-09-29 MED ORDER — MORPHINE SULFATE (PF) 4 MG/ML IV SOLN
4.0000 mg | Freq: Once | INTRAVENOUS | Status: AC
  Administered 2022-09-29: 4 mg via INTRAVENOUS
  Filled 2022-09-29: qty 1

## 2022-09-29 MED ORDER — METOCLOPRAMIDE HCL 10 MG PO TABS: 10.0000 mg | ORAL_TABLET | Freq: Three times a day (TID) | ORAL | 0 refills | Status: AC

## 2022-09-29 MED ORDER — ONDANSETRON HCL 4 MG/2ML IJ SOLN
4.0000 mg | Freq: Once | INTRAMUSCULAR | Status: AC
  Administered 2022-09-29: 4 mg via INTRAVENOUS
  Filled 2022-09-29: qty 2

## 2022-09-29 MED ORDER — ALUMINUM-MAGNESIUM-SIMETHICONE 200-200-20 MG/5ML PO SUSP: 30.0000 mL | Freq: Three times a day (TID) | ORAL | 0 refills | Status: AC

## 2022-09-29 NOTE — ED Notes (Signed)
Patient crying with movement while completing care

## 2022-09-29 NOTE — ED Provider Notes (Signed)
[facility] Provider Note    Event Date/Time   First MD Initiated Contact with Patient 09/29/22 [phone]     (approximate)   History   Chest Pain and Abdominal Pain   HPI  Mark Brooks is a 57 y.o. male   Past medical history of type I diabetic, CAD, CKD, diabetic neuropathy, neuropathic pain longstanding, hypertension hyperlipidemia, CHF who presents to the emergency department with whole body pain ongoing for years, poor p.o. intake over the last 3 days.  His pain has not changed in quality location severity and he states that it affects his whole body, sensations of tingling and pain through bilateral upper extremities and lower extremities worse on the left side including his chest and abdomen.  He has been compliant with all of his medications.    Independent Historian contributed to assessment above: His wife is at bedside to corroborate information given above  External Medical Documents Reviewed: Internal medicine office visits from June documenting his history of neuropathic pain, poorly controlled, recently prescribed pregabalin the patient had not taken due to not wanting to take too many pills      Physical Exam   Triage Vital Signs: ED Triage Vitals  Encounter Vitals Group     BP 09/29/22 0726 (!) 150/110     Systolic BP Percentile --      Diastolic BP Percentile --      Pulse Rate 09/29/22 0726 (!) 112     Resp 09/29/22 0726 19     Temp 09/29/22 0726 100 F (37.8 C)     Temp Source 09/29/22 0726 Oral     SpO2 09/29/22 0726 100 %     Weight 09/29/22 0727 185 lb (83.9 kg)     Height 09/29/22 0727 5\' 8"  (1.727 m)     Head Circumference --      Peak Flow --      Pain Score 09/29/22 0726 10     Pain Loc --      Pain Education --      Exclude from Growth Chart --     Most recent vital signs: Vitals:   09/29/22 0726  BP: (!) 150/110  Pulse: (!) 112  Resp: 19  Temp: 100 F (37.8 C)  SpO2: 100%    General: Awake, no distress.   CV:  Good peripheral perfusion.  Resp:  Normal effort.  Abd:  No distention.  Other:  Comfortable appearing laying in the stretcher in no acute distress.  He has a soft and nontender abdomen to deep palpation all quadrants and clear lungs.  Skin appears warm and well-perfused and he does not appear toxic.  He is moving all extremities with full active range of motion and has sensation intact throughout his body, no facial asymmetry or dysarthria   ED Results / Procedures / Treatments   Labs (all labs ordered are listed, but only abnormal results are displayed) Labs Reviewed  BRAIN NATRIURETIC PEPTIDE - Abnormal; Notable for the following components:      Result Value   B Natriuretic Peptide 257.2 (*)    All other components within normal limits  CBC  HEPATIC FUNCTION PANEL  LACTIC ACID, PLASMA  BASIC METABOLIC PANEL  LIPASE, BLOOD  LACTIC ACID, PLASMA  TROPONIN I (HIGH SENSITIVITY)  TROPONIN I (HIGH SENSITIVITY)     I ordered and reviewed the above labs they are notable for normal white count and H&H  EKG  ED ECG REPORT I, Pilar Jarvis, the  attending physician, personally viewed and interpreted this ECG.   Date: 09/29/2022  EKG Time: 0724  Rate: 105  Rhythm: sinus tachycardia  Axis: nl  Intervals:none  ST&T Change: no stemi    RADIOLOGY I independently reviewed and interpreted chest x-ray and see no obvious focality or pneumothorax   PROCEDURES:  Critical Care performed: No  Procedures   MEDICATIONS ORDERED IN ED: Medications  pantoprazole (PROTONIX) injection 40 mg (40 mg Intravenous Given 09/29/22 0814)  ondansetron (ZOFRAN) injection 4 mg (4 mg Intravenous Given 09/29/22 0811)  morphine (PF) 4 MG/ML injection 4 mg (4 mg Intravenous Given 09/29/22 8469)     IMPRESSION / MDM / ASSESSMENT AND PLAN / ED COURSE  I reviewed the triage vital signs and the nursing notes.                                Patient's presentation is most consistent with acute  presentation with potential threat to life or bodily function.  Differential diagnosis includes, but is not limited to, neuropathic pain, intra-abdominal infection, ACS, DKA or electrolyte derangement, CVA   The patient is on the cardiac monitor to evaluate for evidence of arrhythmia and/or significant heart rate changes.  MDM:    Longstanding neuropathic pain, described as similar to prior unchanged and with symptoms consistent with neuropathic pain and tingling sensation throughout the entire body.  Has had poor p.o. intake, check electrolytes and creatinine function.  She has had some nausea as well, type I diabetic check for signs of DKA with lab testing.  Since his history of ACS with chest pain involved in his whole body pain check for ACS with EKG which fortunately looks nonischemic and serial troponin.  Treated with 1 dose of IV morphine during initial workup with good pain relief comfortable.  Given no focal neurologic deficits I doubt stroke.   Ultimately if workup above is unremarkable given the chronicity of symptoms I doubt emergent conditions to account for his whole body neuropathic pain at this time, plan will be for discharge and close PMD follow-up.        FINAL CLINICAL IMPRESSION(S) / ED DIAGNOSES   Final diagnoses:  Atypical chest pain  Neuropathic pain     Rx / DC Orders   ED Discharge Orders     None        Note:  This document was prepared using Dragon voice recognition software and may include unintentional dictation errors.    Pilar Jarvis, MD 09/29/22 [phone]

## 2022-09-29 NOTE — ED Triage Notes (Signed)
Patient in via POV, reports left sided chest pain and LUQ abdominal pain w/ N/V x 1 episode, denies any diarrhea.  States pain has progressed over the last 3 days, also w/ diminished appetite; patient appears uncomfortable in triage.

## 2022-09-29 NOTE — Discharge Instructions (Signed)
Fortunately your testing in the emergency department did not show any emergency conditions that account for your pain like a heart attack.  Follow-up with your primary doctor to discuss further medications and treatment options for your neuropathy.  Thank you for choosing [place] for your health care today!  Please see your primary doctor this week for a follow up appointment.   If you have any new, worsening, or unexpected symptoms call your doctor right away or come back to the emergency department for reevaluation.  It was my pleasure to care for you today.   Daneil Dan [place] Charon, MD

## 2022-10-03 ENCOUNTER — Emergency Department: Payer: Medicare Other

## 2022-10-03 ENCOUNTER — Emergency Department
Admission: EM | Admit: 2022-10-03 | Discharge: 2022-10-03 | Disposition: A | Discharge status: Home / Self Care | Payer: Medicare Other | Attending: Emergency Medicine | Admitting: Emergency Medicine

## 2022-10-03 DIAGNOSIS — I10 Essential (primary) hypertension: Secondary | ICD-10-CM | POA: Diagnosis not present

## 2022-10-03 DIAGNOSIS — M791 Myalgia, unspecified site: Secondary | ICD-10-CM | POA: Insufficient documentation

## 2022-10-03 DIAGNOSIS — E1165 Type 2 diabetes mellitus with hyperglycemia: Secondary | ICD-10-CM | POA: Diagnosis not present

## 2022-10-03 DIAGNOSIS — R778 Other specified abnormalities of plasma proteins: Secondary | ICD-10-CM | POA: Insufficient documentation

## 2022-10-03 DIAGNOSIS — M7918 Myalgia, other site: Secondary | ICD-10-CM

## 2022-10-03 DIAGNOSIS — R1012 Left upper quadrant pain: Secondary | ICD-10-CM | POA: Diagnosis not present

## 2022-10-03 DIAGNOSIS — I2510 Atherosclerotic heart disease of native coronary artery without angina pectoris: Secondary | ICD-10-CM | POA: Insufficient documentation

## 2022-10-03 DIAGNOSIS — R1084 Generalized abdominal pain: Secondary | ICD-10-CM | POA: Diagnosis not present

## 2022-10-03 LAB — CBC WITH DIFFERENTIAL/PLATELET
Abs Immature Granulocytes: 0.01 10*3/uL (ref 0.00–0.07)
Basophils Absolute: 0 10*3/uL (ref 0.0–0.1)
Basophils Relative: 0 %
Eosinophils Absolute: 0.1 10*3/uL (ref 0.0–0.5)
Eosinophils Relative: 2 %
HCT: 44.4 % (ref 39.0–52.0)
Hemoglobin: 14.1 g/dL (ref 13.0–17.0)
Immature Granulocytes: 0 %
Lymphocytes Relative: 22 %
Lymphs Abs: 0.9 10*3/uL (ref 0.7–4.0)
MCH: 26.6 pg (ref 26.0–34.0)
MCHC: 31.8 g/dL (ref 30.0–36.0)
MCV: 83.8 fL (ref 80.0–100.0)
Monocytes Absolute: 0.3 10*3/uL (ref 0.1–1.0)
Monocytes Relative: 7 %
Neutro Abs: 2.7 10*3/uL (ref 1.7–7.7)
Neutrophils Relative %: 69 %
Platelets: 203 10*3/uL (ref 150–400)
RBC: 5.3 MIL/uL (ref 4.22–5.81)
RDW: 13.8 % (ref 11.5–15.5)
WBC: 4 10*3/uL (ref 4.0–10.5)
nRBC: 0 % (ref 0.0–0.2)

## 2022-10-03 LAB — COMPREHENSIVE METABOLIC PANEL
ALT: 24 U/L (ref 0–44)
AST: 26 U/L (ref 15–41)
Albumin: 3.5 g/dL (ref 3.5–5.0)
Alkaline Phosphatase: 83 U/L (ref 38–126)
Anion gap: 12 (ref 5–15)
BUN: 60 mg/dL — ABNORMAL HIGH (ref 6–20)
CO2: 22 mmol/L (ref 22–32)
Calcium: 8.5 mg/dL — ABNORMAL LOW (ref 8.9–10.3)
Chloride: 92 mmol/L — ABNORMAL LOW (ref 98–111)
Creatinine, Ser: 3.22 mg/dL — ABNORMAL HIGH (ref 0.61–1.24)
GFR, Estimated: 22 mL/min — ABNORMAL LOW (ref >60)
Glucose, Bld: 599 mg/dL (ref 70–99)
Potassium: 5.4 mmol/L — ABNORMAL HIGH (ref 3.5–5.1)
Sodium: 126 mmol/L — ABNORMAL LOW (ref 135–145)
Total Bilirubin: 1.4 mg/dL — ABNORMAL HIGH (ref 0.3–1.2)
Total Protein: 7.6 g/dL (ref 6.5–8.1)

## 2022-10-03 LAB — URINALYSIS, ROUTINE W REFLEX MICROSCOPIC
Bacteria, UA: NONE SEEN
Bilirubin Urine: NEGATIVE
Glucose, UA: >=500 mg/dL — AB
Ketones, ur: NEGATIVE mg/dL
Leukocytes,Ua: NEGATIVE
Nitrite: NEGATIVE
Protein, ur: 100 mg/dL — AB
Specific Gravity, Urine: 1.017 (ref 1.005–1.030)
Squamous Epithelial / HPF: NONE SEEN /HPF (ref 0–5)
pH: 6 (ref 5.0–8.0)

## 2022-10-03 LAB — CBG MONITORING, ED
Glucose-Capillary: 448 mg/dL — ABNORMAL HIGH (ref 70–99)
Glucose-Capillary: 414 mg/dL — ABNORMAL HIGH (ref 70–99)

## 2022-10-03 LAB — TROPONIN I (HIGH SENSITIVITY)
Troponin I (High Sensitivity): 54 ng/L — ABNORMAL HIGH (ref <18)
Troponin I (High Sensitivity): 52 ng/L — ABNORMAL HIGH (ref <18)

## 2022-10-03 LAB — LIPASE, BLOOD: Lipase: 25 U/L (ref 11–51)

## 2022-10-03 MED ORDER — INSULIN ASPART 100 UNIT/ML IJ SOLN
10.0000 [IU] | Freq: Once | INTRAMUSCULAR | Status: AC
  Administered 2022-10-03: 10 [IU] via INTRAVENOUS
  Filled 2022-10-03: qty 1

## 2022-10-03 MED ORDER — PREGABALIN 50 MG PO CAPS
75.0000 mg | ORAL_CAPSULE | Freq: Once | ORAL | Status: AC
  Administered 2022-10-03: 75 mg via ORAL
  Filled 2022-10-03: qty 1

## 2022-10-03 MED ORDER — SODIUM CHLORIDE 0.9 % IV BOLUS
1000.0000 mL | Freq: Once | INTRAVENOUS | Status: AC
  Administered 2022-10-03: 1000 mL via INTRAVENOUS

## 2022-10-03 NOTE — ED Triage Notes (Signed)
Pt presents to ER via ems from home with c/o pain all over.  Pt states this has been going on for a long time.  Pt was seen here 7/20 for same and discharged.  Pt states pain has become worse since being seen last time.  Pt states pain is now in his back.  Denies any injuries, or hx of sickle cell disease, fibromyalgia or neuropathy.  Pt is otherwise A&O x4 in NAD.

## 2022-10-03 NOTE — ED Triage Notes (Signed)
EMS brings pt in from home for c/o "pain all over"

## 2022-10-03 NOTE — ED Notes (Signed)
Patient Alert and oriented to baseline. Stable and ambulatory to baseline. Patient verbalized understanding of the discharge instructions.  Patient belongings were taken by the patient.   

## 2022-10-03 NOTE — Inpatient Diabetes Management (Addendum)
Inpatient Diabetes Program Recommendations  AACE/ADA: New Consensus Statement on Inpatient Glycemic Control (2015)  Target Ranges:  Prepandial:   less than 140 mg/dL      Peak postprandial:   less than 180 mg/dL (1-2 hours)      Critically ill patients:  140 - 180 mg/dL   Lab Results  Component Value Date   GLUCAP 448 (H) 10/03/2022   HGBA1C 11.2 (H) 12/14/2021    Review of Glycemic Control  Latest Reference Range & Units 10/03/22 11:52  Glucose-Capillary 70 - 99 mg/dL 161 (H)   Diabetes history: DM 1 Outpatient Diabetes medications:  Novolog 70/30 30-40 units bid Current orders for Inpatient glycemic control:  None yet  Inpatient Diabetes Program Recommendations:    Unclear if patient is taking insulin at home.  Will need to verify. A1C from October of 2023 was very high.  If not admitted, will need very close f/u with PCP. Pt/ seen by [facility] on 6/24 and was supposed to be taking 70/30 35 units bid. He was also prescribed Dexcom Sensor.    Thanks,  Beryl Meager, RN, BC-ADM Inpatient Diabetes Coordinator Pager [phone]  (8a-5p)

## 2022-10-03 NOTE — ED Provider Notes (Signed)
[facility] Provider Note    Event Date/Time   First MD Initiated Contact with Patient 10/03/22 [phone]     (approximate)   History   Generalized Body Aches   HPI  Mark Brooks is a 57 y.o. male with history of coronary artery disease, MI, diabetes, hypertension and as listed in EMR presents to the emergency department for evaluation of pain all over.  He has had pain for several months.  Today pain is mainly in the upper abdomen and right flank area.  He denies injury.  He was evaluated here on 7/20 for similar symptoms..      Physical Exam   Triage Vital Signs: ED Triage Vitals  Encounter Vitals Group     BP 10/03/22 0642 (!) 156/95     Systolic BP Percentile --      Diastolic BP Percentile --      Pulse Rate 10/03/22 0642 92     Resp 10/03/22 0642 20     Temp 10/03/22 0642 98.8 F (37.1 C)     Temp Source 10/03/22 0642 Oral     SpO2 10/03/22 0635 100 %     Weight --      Height --      Head Circumference --      Peak Flow --      Pain Score 10/03/22 0702 10     Pain Loc --      Pain Education --      Exclude from Growth Chart --     Most recent vital signs: Vitals:   10/03/22 0730 10/03/22 1455  BP: (!) 162/91 (!) 173/92  Pulse: 89 83  Resp: 19 16  Temp:  98.2 F (36.8 C)  SpO2: 96% 91%    General: Awake, no distress.  CV:  Good peripheral perfusion.  Resp:  Normal effort.  Abd:  No distention.  Left upper quadrant tender to palpation.  No CVA tenderness. Other:     ED Results / Procedures / Treatments   Labs (all labs ordered are listed, but only abnormal results are displayed) Labs Reviewed  URINALYSIS, ROUTINE W REFLEX MICROSCOPIC - Abnormal; Notable for the following components:      Result Value   Color, Urine YELLOW (*)    APPearance CLEAR (*)    Glucose, UA >=500 (*)    Hgb urine dipstick SMALL (*)    Protein, ur 100 (*)    All other components within normal limits  COMPREHENSIVE METABOLIC PANEL - Abnormal;  Notable for the following components:   Sodium 126 (*)    Potassium 5.4 (*)    Chloride 92 (*)    Glucose, Bld 599 (*)    BUN 60 (*)    Creatinine, Ser 3.22 (*)    Calcium 8.5 (*)    Total Bilirubin 1.4 (*)    GFR, Estimated 22 (*)    All other components within normal limits  CBG MONITORING, ED - Abnormal; Notable for the following components:   Glucose-Capillary 448 (*)    All other components within normal limits  CBG MONITORING, ED - Abnormal; Notable for the following components:   Glucose-Capillary 414 (*)    All other components within normal limits  TROPONIN I (HIGH SENSITIVITY) - Abnormal; Notable for the following components:   Troponin I (High Sensitivity) 54 (*)    All other components within normal limits  TROPONIN I (HIGH SENSITIVITY) - Abnormal; Notable for the following components:   Troponin I (High  Sensitivity) 52 (*)    All other components within normal limits  CBC WITH DIFFERENTIAL/PLATELET  LIPASE, BLOOD     EKG  Not indicated   RADIOLOGY  Image and radiology report reviewed and interpreted by me. Radiology report consistent with the same.  CT of the abdomen and pelvis without contrast shows no acute concerns  PROCEDURES:  Critical Care performed: No  Procedures   MEDICATIONS ORDERED IN ED:  Medications  pregabalin (LYRICA) capsule 75 mg (75 mg Oral Given 10/03/22 0723)  sodium chloride 0.9 % bolus 1,000 mL (0 mLs Intravenous Stopped 10/03/22 1152)  insulin aspart (novoLOG) injection 10 Units (10 Units Intravenous Given 10/03/22 0921)  sodium chloride 0.9 % bolus 1,000 mL (0 mLs Intravenous Stopped 10/03/22 1612)     IMPRESSION / MDM / ASSESSMENT AND PLAN / ED COURSE   I have reviewed the triage note.  Differential diagnosis includes, but is not limited to, pancreatitis, colitis, musculoskeletal pain, kidney stone, functional symptoms  Patient's presentation is most consistent with acute presentation with potential threat to life or  bodily function.  57 year old male presenting to the emergency department for treatment and evaluation of pain all over his body which today pain in the abdomen and left flank are the worst.  See HPI for further details.  Labs obtained while awaiting ER room assignment shows hyperglycemia at 599 with a BUN of 60, creatinine of 3.2, and GFR of 22.  Troponin is 52 and based on last labs troponin is chronically elevated.  Lipase is normal at 25. Urinalysis shows small amount of hemoglobin, protein is 100, greater than 500 glucose.  After 1 liter of NS and 10 units of insulin, glucose is down to 448. Second liter of fluid ordered.  Glucose continues to improve.  500 mL of the saline have infused and it is down to 414.  CT of the abdomen and pelvis is negative for acute findings, but there is a focus in the upper pole of the right kidney.  Patient discharged home and encouraged to take his insulin as prescribed and follow up with primary care.      FINAL CLINICAL IMPRESSION(S) / ED DIAGNOSES   Final diagnoses:  Musculoskeletal pain  Generalized abdominal pain     Rx / DC Orders   ED Discharge Orders     None        Note:  This document was prepared using Dragon voice recognition software and may include unintentional dictation errors.   Chinita Pester, FNP 10/04/22 2306    Merwyn Katos, MD 10/12/22 [phone]
